# Patient Record
Sex: Male | Born: 1980
Health system: Southern US, Community
[De-identification: ages and names within clinical notes are randomized; demographics above are authoritative.]

---

## 2016-12-23 ENCOUNTER — Encounter (HOSPITAL_COMMUNITY): Payer: Self-pay | Admitting: Family Medicine

## 2016-12-23 ENCOUNTER — Ambulatory Visit (HOSPITAL_COMMUNITY)
Admission: EM | Admit: 2016-12-23 | Discharge: 2016-12-23 | Disposition: A | Discharge status: Home / Self Care | Payer: BLUE CROSS/BLUE SHIELD | Attending: Internal Medicine | Admitting: Internal Medicine

## 2016-12-23 DIAGNOSIS — N644 Mastodynia: Secondary | ICD-10-CM

## 2016-12-23 NOTE — ED Triage Notes (Signed)
Pt here for lump to left chest area. Denies drainage. Reports x 1 month and tender to touch.

## 2016-12-23 NOTE — Discharge Instructions (Signed)
Take naproxen 440mg  twice a day for 10 days for pain and inflammation. You will need further evaluation that we cannot provide here, please establish PCP care for further evaluation and treatment needed. If noticing spreading redness, increased warmth, fever, follow up for reevaluation.

## 2016-12-23 NOTE — ED Provider Notes (Signed)
[facility]    CSN: [id number] Arrival date & time: 12/23/16  1820     History   Chief Complaint Chief Complaint  Patient presents with  . Chest Pain  . Breast Mass    HPI Clayton Pacheco is a 36 y.o. male.   36 year old male comes in for 1 month history of left breast pain/mass. States that it is tender to palpation without nipple drainage. States left breast is larger than the right, breast mass has not changed in size. Denies spreading erythema, increased warmth, fever. Has not done anything for it. States used to do strenuous workout with heavy weight lifting, but has discontinued. Denies shortness of breath, diaphoresis, weakness, dizziness, numbness/tingling. Family history of cardiac disease, grandmother with bypass surgery at age 38, though without MI. Denies family history of breast cancer.       History reviewed. No pertinent past medical history.  There are no active problems to display for this patient.   History reviewed. No pertinent surgical history.     Home Medications    Prior to Admission medications   Not on File    Family History History reviewed. No pertinent family history.  Social History Social History   Tobacco Use  . Smoking status: Never Smoker  . Smokeless tobacco: Never Used  Substance Use Topics  . Alcohol use: Not on file  . Drug use: Not on file     Allergies   Patient has no known allergies.   Review of Systems Review of Systems  Reason unable to perform ROS: See HPI as above.     Physical Exam Triage Vital Signs ED Triage Vitals [12/23/16 1854]  Enc Vitals Group     BP (!) 147/98     Pulse Rate 62     Resp 18     Temp      Temp src      SpO2 100 %     Weight      Height      Head Circumference      Peak Flow      Pain Score      Pain Loc      Pain Edu?      Excl. in GC?    No data found.  Updated Vital Signs BP (!) 147/98   Pulse 62   Resp 18   SpO2 100%   Physical Exam    Constitutional: He is oriented to person, place, and time. He appears well-developed and well-nourished. No distress.  HENT:  Head: Normocephalic and atraumatic.  Eyes: Conjunctivae are normal. Pupils are equal, round, and reactive to light.  Cardiovascular: Normal rate, regular rhythm and normal heart sounds. Exam reveals no gallop and no friction rub.  No murmur heard. Pulmonary/Chest: Effort normal and breath sounds normal. No stridor. He has no wheezes. He has no rales. Tenderness: left. Right breast exhibits no inverted nipple, no mass, no nipple discharge and no skin change. Left breast exhibits tenderness. Left breast exhibits no inverted nipple, no mass, no nipple discharge and no skin change. Breasts are symmetrical.  Left breast larger than right. No erythema, increased warmth. No obvious mass palpated. Generalized tenderness to palpation of left breast.   Neurological: He is alert and oriented to person, place, and time.     UC Treatments / Results  Labs (all labs ordered are listed, but only abnormal results are displayed) Labs Reviewed - No data to display  EKG  EKG Interpretation  None       Radiology No results found.  Procedures Procedures (including critical care time)  Medications Ordered in UC Medications - No data to display   Initial Impression / Assessment and Plan / UC Course  I have reviewed the triage vital signs and the nursing notes.  Pertinent labs & imaging results that were available during my care of the patient were reviewed by me and considered in my medical decision making (see chart for details).    NSAIDs for pain and possible inflammation. Patient will need to follow up with PCP for further testing of breast asymmetry. Resources given. Return precautions given.   Final Clinical Impressions(s) / UC Diagnoses   Final diagnoses:  Breast pain, left    ED Discharge Orders    None        Lurline IdolYu, Amy V, PA-C 12/23/16 2008

## 2017-04-18 ENCOUNTER — Ambulatory Visit: Payer: BLUE CROSS/BLUE SHIELD | Admitting: Family Medicine

## 2017-04-18 VITALS — BP 125/90 | HR 62 | Temp 98.4°F | Ht 71.0 in | Wt 230.8 lb

## 2017-04-18 DIAGNOSIS — Z0000 Encounter for general adult medical examination without abnormal findings: Secondary | ICD-10-CM | POA: Diagnosis not present

## 2017-04-18 DIAGNOSIS — N6002 Solitary cyst of left breast: Secondary | ICD-10-CM | POA: Diagnosis not present

## 2017-04-18 LAB — POCT GLYCOSYLATED HEMOGLOBIN (HGB A1C): Hemoglobin A1C: 5.4

## 2017-04-18 NOTE — Patient Instructions (Addendum)
It was great meeting you today Clayton Pacheco! I am glad that you are feeling well, and that you are having no significant issues. We did some blood work today, and I will call you with those results. If you feel that the fluid in your chest is increasing, becoming more inflamed, or is hurting more please let me know and we will work this up. I am glad that you are motivated to lose weight and that you are exercising. Otherwise I will see you back in 1 year for your annual wellness visit.

## 2017-04-18 NOTE — Progress Notes (Signed)
  HPI:  Patient presents today for a new patient appointment to establish general primary care, Needs a physical for annual renewal of his health insurance offered at his job.  Only issue is that he notices asymmetry in his chest. Feels as though he has fluid under his left areola which accounts for this asymmetry. Has been improving since he saw urgent care for the same issue back in November.  ROS: See HPI  Past Medical Hx:  - none  Past Surgical Hx:  - none  Family Hx: updated in Epic  Social Hx: lives at home with his wife and step daughter. Works non physically intensive job at [employer]. Is in graduate school.  Health Maintenance:  - Unsure if has had TDAP - otherwise up to date  PHYSICAL EXAM: BP 125/90 (BP Location: Left Arm, Patient Position: Sitting, Cuff Size: Normal)   Pulse 62   Temp 98.4 F (36.9 C) (Oral)   Ht 5\' 11"  (1.803 m)   Wt 230 lb 12.8 oz (104.7 kg)   SpO2 97%   BMI 32.19 kg/m  Gen: Muscular African American male in no acute distress, resting comfortably in seat HEENT: small amount of cerumen noted in Bilateral ears R>L.  Heart: rrr, no m/r/g. Palpable peripheral pulses In BLE, BUE. Lungs: clear to auscultation bilaterally, symmetric chest rise Abdomen: soft, non-tender non-distended. Neuro: alert, oriented x3. 5/5 strength all muscle groups BUE, BLE. Cn 2-12 intact Chest: Fluid-filled area under left areola. No erythema, tenderness, or other concerning signs  ASSESSMENT/PLAN:  # Health maintenance:  - needs tdap - will order cbc, cmp, a1c, and lipid panel for screening  Breast cyst, left Likely to be a benign breast cyst. Asked patient to come back for follow up if develops erythema, increased pain, increased size. Could consider ultrasound and possible drainage if needed.     FOLLOW UP: Follow up in 1 year for annual physical   Myrene BuddyJacob Cynda Soule MD PGY-1 Family Medicine Resident

## 2017-04-19 ENCOUNTER — Encounter: Payer: Self-pay | Admitting: Family Medicine

## 2017-04-19 DIAGNOSIS — Z0000 Encounter for general adult medical examination without abnormal findings: Secondary | ICD-10-CM | POA: Insufficient documentation

## 2017-04-19 DIAGNOSIS — N6002 Solitary cyst of left breast: Secondary | ICD-10-CM | POA: Insufficient documentation

## 2017-04-19 LAB — CBC WITH DIFFERENTIAL/PLATELET
BASOS ABS: 0 10*3/uL (ref 0.0–0.2)
Basos: 1 %
EOS (ABSOLUTE): 0.3 10*3/uL (ref 0.0–0.4)
Eos: 4 %
Hematocrit: 42.5 % (ref 37.5–51.0)
Hemoglobin: 14.4 g/dL (ref 13.0–17.7)
IMMATURE GRANS (ABS): 0 10*3/uL (ref 0.0–0.1)
IMMATURE GRANULOCYTES: 0 %
LYMPHS: 37 %
Lymphocytes Absolute: 2.7 10*3/uL (ref 0.7–3.1)
MCH: 29.2 pg (ref 26.6–33.0)
MCHC: 33.9 g/dL (ref 31.5–35.7)
MCV: 86 fL (ref 79–97)
MONOS ABS: 0.6 10*3/uL (ref 0.1–0.9)
Monocytes: 8 %
NEUTROS PCT: 50 %
Neutrophils Absolute: 3.6 10*3/uL (ref 1.4–7.0)
PLATELETS: 327 10*3/uL (ref 150–379)
RBC: 4.93 x10E6/uL (ref 4.14–5.80)
RDW: 14.5 % (ref 12.3–15.4)
WBC: 7.2 10*3/uL (ref 3.4–10.8)

## 2017-04-19 LAB — COMPREHENSIVE METABOLIC PANEL
A/G RATIO: 1.3 (ref 1.2–2.2)
ALT: 19 IU/L (ref 0–44)
AST: 24 IU/L (ref 0–40)
Albumin: 4.2 g/dL (ref 3.5–5.5)
Alkaline Phosphatase: 80 IU/L (ref 39–117)
BUN/Creatinine Ratio: 9 (ref 9–20)
BUN: 12 mg/dL (ref 6–20)
Bilirubin Total: 0.3 mg/dL (ref 0.0–1.2)
CALCIUM: 9.3 mg/dL (ref 8.7–10.2)
CO2: 24 mmol/L (ref 20–29)
Chloride: 102 mmol/L (ref 96–106)
Creatinine, Ser: 1.29 mg/dL — ABNORMAL HIGH (ref 0.76–1.27)
GFR calc Af Amer: 81 mL/min/{1.73_m2} (ref >59)
GFR, EST NON AFRICAN AMERICAN: 70 mL/min/{1.73_m2} (ref >59)
GLUCOSE: 83 mg/dL (ref 65–99)
Globulin, Total: 3.2 g/dL (ref 1.5–4.5)
POTASSIUM: 4.4 mmol/L (ref 3.5–5.2)
Sodium: 141 mmol/L (ref 134–144)
Total Protein: 7.4 g/dL (ref 6.0–8.5)

## 2017-04-19 LAB — LIPID PANEL
CHOL/HDL RATIO: 5 ratio (ref 0.0–5.0)
Cholesterol, Total: 185 mg/dL (ref 100–199)
HDL: 37 mg/dL — AB (ref >39)
LDL Calculated: 117 mg/dL — ABNORMAL HIGH (ref 0–99)
TRIGLYCERIDES: 153 mg/dL — AB (ref 0–149)
VLDL CHOLESTEROL CAL: 31 mg/dL (ref 5–40)

## 2017-04-19 NOTE — Assessment & Plan Note (Signed)
Likely to be a benign breast cyst. Asked patient to come back for follow up if develops erythema, increased pain, increased size. Could consider ultrasound and possible drainage if needed.

## 2017-04-20 ENCOUNTER — Telehealth: Payer: Self-pay | Admitting: Family Medicine

## 2017-04-20 NOTE — Telephone Encounter (Signed)
Called to inform patient that A1C and cbc were both normal. Informed him that triglyceride and LDH were both slightly elevated. Suspect these were artificially elevated as he had eaten that day. Creatinine 1.27. Patient unaware of any problems with creatinine in the past. Has not been taking any supplements that could artificially elevate creatinine. Advised to continue to drink plenty of water. Titrate amount to avoid thirst and shoot for clear urine color. Also applauded for continuing to exercise well. All questions answered, patient voiced understanding.  Myrene BuddyJacob Delphin Funes MD PGY-1 Family Medicine Resident

## 2020-05-07 ENCOUNTER — Telehealth: Payer: Self-pay | Admitting: *Deleted

## 2020-05-07 NOTE — Telephone Encounter (Signed)
Patient calls because he is having a pulling sensation in chest.  He reports no injury that he is aware of.  He also reports having this sensation for about a year but since Sunday has been happening more frequently.  He denies SOB, arm pain or nausea.  No appts available today and advised if it has worried him enough to call us I think it was worth having a provider check him out. Advised urgent care.  Also pt has not been seen in 3 years.  Appt for physical made 06/09/20. Jone Baseman, CMA

## 2020-05-09 ENCOUNTER — Other Ambulatory Visit: Payer: Self-pay

## 2020-05-09 ENCOUNTER — Ambulatory Visit (HOSPITAL_COMMUNITY)
Admission: EM | Admit: 2020-05-09 | Discharge: 2020-05-09 | Disposition: A | Discharge status: Home / Self Care | Payer: BLUE CROSS/BLUE SHIELD | Attending: Internal Medicine | Admitting: Internal Medicine

## 2020-05-09 ENCOUNTER — Encounter (HOSPITAL_COMMUNITY): Payer: Self-pay

## 2020-05-09 ENCOUNTER — Emergency Department (HOSPITAL_BASED_OUTPATIENT_CLINIC_OR_DEPARTMENT_OTHER): Payer: BC Managed Care – PPO | Admitting: Radiology

## 2020-05-09 ENCOUNTER — Emergency Department (HOSPITAL_BASED_OUTPATIENT_CLINIC_OR_DEPARTMENT_OTHER)
Admission: EM | Admit: 2020-05-09 | Discharge: 2020-05-09 | Disposition: A | Discharge status: Home / Self Care | Payer: BC Managed Care – PPO | Attending: Emergency Medicine | Admitting: Emergency Medicine

## 2020-05-09 DIAGNOSIS — R030 Elevated blood-pressure reading, without diagnosis of hypertension: Secondary | ICD-10-CM

## 2020-05-09 DIAGNOSIS — R079 Chest pain, unspecified: Secondary | ICD-10-CM | POA: Diagnosis not present

## 2020-05-09 DIAGNOSIS — R7989 Other specified abnormal findings of blood chemistry: Secondary | ICD-10-CM

## 2020-05-09 LAB — BASIC METABOLIC PANEL
Anion gap: 9 (ref 5–15)
BUN: 14 mg/dL (ref 6–20)
CO2: 26 mmol/L (ref 22–32)
Calcium: 9.5 mg/dL (ref 8.9–10.3)
Chloride: 104 mmol/L (ref 98–111)
Creatinine, Ser: 1.26 mg/dL — ABNORMAL HIGH (ref 0.61–1.24)
GFR, Estimated: >60 mL/min (ref >60)
Glucose, Bld: 78 mg/dL (ref 70–99)
Potassium: 4.3 mmol/L (ref 3.5–5.1)
Sodium: 139 mmol/L (ref 135–145)

## 2020-05-09 LAB — CBC WITH DIFFERENTIAL/PLATELET
Abs Immature Granulocytes: 0.01 10*3/uL (ref 0.00–0.07)
Basophils Absolute: 0 10*3/uL (ref 0.0–0.1)
Basophils Relative: 1 %
Eosinophils Absolute: 0.3 10*3/uL (ref 0.0–0.5)
Eosinophils Relative: 4 %
HCT: 44.2 % (ref 39.0–52.0)
Hemoglobin: 14.7 g/dL (ref 13.0–17.0)
Immature Granulocytes: 0 %
Lymphocytes Relative: 33 %
Lymphs Abs: 2.4 10*3/uL (ref 0.7–4.0)
MCH: 29.1 pg (ref 26.0–34.0)
MCHC: 33.3 g/dL (ref 30.0–36.0)
MCV: 87.5 fL (ref 80.0–100.0)
Monocytes Absolute: 0.6 10*3/uL (ref 0.1–1.0)
Monocytes Relative: 8 %
Neutro Abs: 3.9 10*3/uL (ref 1.7–7.7)
Neutrophils Relative %: 54 %
Platelets: 335 10*3/uL (ref 150–400)
RBC: 5.05 MIL/uL (ref 4.22–5.81)
RDW: 13.6 % (ref 11.5–15.5)
WBC: 7.2 10*3/uL (ref 4.0–10.5)
nRBC: 0 % (ref 0.0–0.2)

## 2020-05-09 LAB — TROPONIN I (HIGH SENSITIVITY): Troponin I (High Sensitivity): 4 ng/L (ref <18)

## 2020-05-09 NOTE — ED Triage Notes (Addendum)
Pt in with c/o left CP that has been going on intermittently for the last few years but has started bothering him more this week  Denies any radiation to arm/jaw, sob, N/V, or dizziness  States that he felt the pain when he was working out and when he is resting

## 2020-05-09 NOTE — ED Provider Notes (Signed)
[facility] EMERGENCY DEPT Provider Note   CSN: [id number] Arrival date & time: 05/09/20  1252     History Chief Complaint  Patient presents with  . Chest Pain    Clayton Pacheco is a 40 y.o. male.  Patient is a 40 year old male who presents with chest pain.  He has no past medical history.  He says for the last 3 days he has had episodes of chest pain.  Describes it as an achy feeling in his left chest.  Nonradiating.  It lasted about 2 to 3 minutes.  He has had about 3 episodes once a day for the last 3 days.  He has not had any chest pain today.  He denies any exertional symptoms.  Its not related to eating.  Its not related to movement.  He does not have any associated symptoms such as shortness of breath, nausea or diaphoresis.  No history of known heart issues.  He has no history in his family of early heart disease.  He is a non-smoker.  He denies any pleuritic symptoms.  No leg pain or swelling.  No fevers, cough or cold symptoms.  He was seen in urgent care today and sent to the ED for further evaluation.        No past medical history on file.  Patient Active Problem List   Diagnosis Date Noted  . Healthcare maintenance 04/19/2017  . Breast cyst, left 04/19/2017    No past surgical history on file.     No family history on file.  Social History   Tobacco Use  . Smoking status: Never Smoker  . Smokeless tobacco: Never Used    Home Medications Prior to Admission medications   Not on File    Allergies    Patient has no known allergies.  Review of Systems   Review of Systems  Constitutional: Negative for chills, diaphoresis, fatigue and fever.  HENT: Negative for congestion, rhinorrhea and sneezing.   Eyes: Negative.   Respiratory: Negative for cough, chest tightness and shortness of breath.   Cardiovascular: Positive for chest pain. Negative for leg swelling.  Gastrointestinal: Negative for abdominal pain, blood in stool,  diarrhea, nausea and vomiting.  Genitourinary: Negative for difficulty urinating, flank pain, frequency and hematuria.  Musculoskeletal: Negative for arthralgias and back pain.  Skin: Negative for rash.  Neurological: Negative for dizziness, speech difficulty, weakness, numbness and headaches.    Physical Exam Updated Vital Signs BP (!) 132/91 (BP Location: Left Arm)   Pulse (!) 57   Temp 98 F (36.7 C)   Resp 18   Ht 5\' 11"  (1.803 m)   Wt 108.9 kg   SpO2 98%   BMI 33.47 kg/m   Physical Exam Constitutional:      Appearance: He is well-developed.  HENT:     Head: Normocephalic and atraumatic.  Eyes:     Pupils: Pupils are equal, round, and reactive to light.  Cardiovascular:     Rate and Rhythm: Normal rate and regular rhythm.     Heart sounds: Normal heart sounds.  Pulmonary:     Effort: Pulmonary effort is normal. No respiratory distress.     Breath sounds: Normal breath sounds. No wheezing or rales.  Chest:     Chest wall: No tenderness.  Abdominal:     General: Bowel sounds are normal.     Palpations: Abdomen is soft.     Tenderness: There is no abdominal tenderness. There is no guarding or rebound.  Musculoskeletal:        General: Normal range of motion.     Cervical back: Normal range of motion and neck supple.     Comments: No edema or calf tenderness  Lymphadenopathy:     Cervical: No cervical adenopathy.  Skin:    General: Skin is warm and dry.     Findings: No rash.  Neurological:     Mental Status: He is alert and oriented to person, place, and time.     ED Results / Procedures / Treatments   Labs (all labs ordered are listed, but only abnormal results are displayed) Labs Reviewed  BASIC METABOLIC PANEL - Abnormal; Notable for the following components:      Result Value   Creatinine, Ser 1.26 (*)    All other components within normal limits  CBC WITH DIFFERENTIAL/PLATELET  TROPONIN I (HIGH SENSITIVITY)  TROPONIN I (HIGH SENSITIVITY)     EKG None  Radiology DG Chest 2 View  Result Date: 05/09/2020 CLINICAL DATA:  Chest pain EXAM: CHEST - 2 VIEW COMPARISON:  None. FINDINGS: The heart size and mediastinal contours are within normal limits. Both lungs are clear. The visualized skeletal structures are unremarkable. IMPRESSION: No active cardiopulmonary disease. Electronically Signed   By: Duanne Guess D.O.   On: 05/09/2020 14:17    Procedures Procedures   Medications Ordered in ED Medications - No data to display  ED Course  I have reviewed the triage vital signs and the nursing notes.  Pertinent labs & imaging results that were available during my care of the patient were reviewed by me and considered in my medical decision making (see chart for details).    MDM Rules/Calculators/A&P                          Patient is a 40 year old male who presents with intermittent episodes of chest pain for the last 3 days.  He has not had any episodes since yesterday.  His EKG does not show any ischemic changes, other than some anterior ST elevation that is likely early repol changes.  There is no old EKG available for comparison.  His chest x-ray is clear without evidence of pneumonia or pneumothorax.  No evidence of pulmonary edema.  His CBC is nonconcerning.  His creatinine is mildly elevated but similar to prior values in 2019.  His troponin is still pending.  Dr. Renaye Rakers to take over care pending this result.  I feel the patient likely only needs 1 troponin as he has not had any pain since yesterday.  If the troponin is negative, he can likely be discharged home with outpatient follow-up.  He will need to follow-up on his elevated creatinine.  Reportedly he has a PCP appointment in April.  Final Clinical Impression(s) / ED Diagnoses Final diagnoses:  Chest pain, unspecified type  Elevated serum creatinine    Rx / DC Orders ED Discharge Orders    None       Rolan Bucco, MD 05/09/20 1511

## 2020-05-09 NOTE — Discharge Instructions (Signed)
Go to the emergency department for evaluation of your chest pain.    Your blood pressure is elevated today at 167/94.  This needs to be rechecked by your primary care provider in 2 to 4 weeks.

## 2020-05-09 NOTE — ED Triage Notes (Signed)
Pt c/o intermittent L sd chest discomfort for years. Urgent care sent to get evaluated. Denies n/v, dizziness, SOB.

## 2020-05-09 NOTE — ED Provider Notes (Signed)
[facility]    CSN: [id number] Arrival date & time: 05/09/20  1133      History   Chief Complaint Chief Complaint  Patient presents with  . chest pain    HPI Clayton Pacheco is a 40 y.o. male.   Patient presents with left sternal chest pain intermittently for the past week.  No chest pain currently.  No injury or known cause.  He describes the chest pain as dull; no aggravating or alleviating factors; no associated symptoms.  He denies personal or family cardiac history.  Non-smoker.  He does not currently have a PCP but has an appointment to establish one at the end of April.  He denies any symptoms currently, including chest pain, shortness of breath, dizziness, weakness, numbness, headache.  No pertinent medical history.  No treatments attempted at home.   The history is provided by the patient and medical records.    History reviewed. No pertinent past medical history.  Patient Active Problem List   Diagnosis Date Noted  . Healthcare maintenance 04/19/2017  . Breast cyst, left 04/19/2017    History reviewed. No pertinent surgical history.     Home Medications    Prior to Admission medications   Not on File    Family History History reviewed. No pertinent family history.  Social History Social History   Tobacco Use  . Smoking status: Never Smoker  . Smokeless tobacco: Never Used     Allergies   Patient has no known allergies.   Review of Systems Review of Systems  Constitutional: Negative for chills, diaphoresis and fever.  HENT: Negative for ear pain and sore throat.   Eyes: Negative for pain and visual disturbance.  Respiratory: Negative for cough and shortness of breath.   Cardiovascular: Positive for chest pain. Negative for palpitations.  Gastrointestinal: Negative for abdominal pain, nausea and vomiting.  Genitourinary: Negative for dysuria and hematuria.  Musculoskeletal: Negative for arthralgias and back pain.  Skin:  Negative for color change and rash.  Neurological: Negative for dizziness, syncope, weakness, numbness and headaches.  All other systems reviewed and are negative.    Physical Exam Triage Vital Signs ED Triage Vitals  Enc Vitals Group     BP 05/09/20 1158 (!) 167/94     Pulse Rate 05/09/20 1158 63     Resp 05/09/20 1158 20     Temp --      Temp src --      SpO2 05/09/20 1158 96 %     Weight --      Height --      Head Circumference --      Peak Flow --      Pain Score 05/09/20 1157 0     Pain Loc --      Pain Edu? --      Excl. in GC? --    No data found.  Updated Vital Signs BP (!) 167/94   Pulse 63   Resp 20   SpO2 96%   Visual Acuity Right Eye Distance:   Left Eye Distance:   Bilateral Distance:    Right Eye Near:   Left Eye Near:    Bilateral Near:     Physical Exam Vitals and nursing note reviewed.  Constitutional:      General: He is not in acute distress.    Appearance: He is well-developed. He is not ill-appearing.  HENT:     Head: Normocephalic and atraumatic.     Mouth/Throat:  Mouth: Mucous membranes are moist.  Eyes:     Conjunctiva/sclera: Conjunctivae normal.  Cardiovascular:     Rate and Rhythm: Normal rate and regular rhythm.     Heart sounds: Normal heart sounds.  Pulmonary:     Effort: Pulmonary effort is normal. No respiratory distress.     Breath sounds: Normal breath sounds.  Abdominal:     Palpations: Abdomen is soft.     Tenderness: There is no abdominal tenderness.  Musculoskeletal:     Cervical back: Neck supple.     Right lower leg: No edema.     Left lower leg: No edema.  Skin:    General: Skin is warm and dry.  Neurological:     General: No focal deficit present.     Mental Status: He is alert and oriented to person, place, and time.     Sensory: No sensory deficit.     Motor: No weakness.     Gait: Gait normal.  Psychiatric:        Mood and Affect: Mood normal.        Behavior: Behavior normal.      UC  Treatments / Results  Labs (all labs ordered are listed, but only abnormal results are displayed) Labs Reviewed - No data to display  EKG   Radiology No results found.  Procedures Procedures (including critical care time)  Medications Ordered in UC Medications - No data to display  Initial Impression / Assessment and Plan / UC Course  I have reviewed the triage vital signs and the nursing notes.  Pertinent labs & imaging results that were available during my care of the patient were reviewed by me and considered in my medical decision making (see chart for details).   Chest pain, elevated blood pressure reading.  EKG shows sinus rhythm, rate 61; 1 mm ST elevation in leads V2, V3, V4; no previous to compare.  Discussed limitations of evaluation of chest pain in an urgent care setting.  Instructed patient to be evaluated in the ED.  Also discussed that his blood pressure is elevated today and needs to be rechecked in 2 to 4 weeks.  He has an appointment to establish a PCP in 4 weeks.  He agrees to plan of care.   Final Clinical Impressions(s) / UC Diagnoses   Final diagnoses:  Chest pain, unspecified type  Elevated blood pressure reading     Discharge Instructions     Go to the emergency department for evaluation of your chest pain.    Your blood pressure is elevated today at 167/94.  This needs to be rechecked by your primary care provider in 2 to 4 weeks.        ED Prescriptions    None     PDMP not reviewed this encounter.   Mickie Bail, NP 05/09/20 1236

## 2020-06-04 NOTE — Progress Notes (Deleted)
    SUBJECTIVE:   CHIEF COMPLAINT / HPI: physical   Physical   ***  PERTINENT  PMH / PSH: ***  OBJECTIVE:   There were no vitals taken for this visit.  ***  ASSESSMENT/PLAN:   No problem-specific Assessment & Plan notes found for this encounter.     Ronnald Ramp, MD [facility]   {    This will disappear when note is signed, click to select method of visit    :1}

## 2020-06-09 ENCOUNTER — Encounter: Payer: BLUE CROSS/BLUE SHIELD | Admitting: Family Medicine

## 2020-06-15 NOTE — Progress Notes (Signed)
Cardiology Office Note:    Date:  06/16/2020   ID:  Clayton Pacheco, DOB August 19, 1980, MRN [id number]  PCP:  Patient, No Pcp Per (Inactive)  Cardiologist:  None  Electrophysiologist:  None   Referring MD: No ref. provider found   Chief Complaint  Patient presents with  . Chest Pain    History of Present Illness:    Clayton Pacheco is a 40 y.o. male with ano PMHx who presents for evaluation of chest pain.  He was seen in the ED on 05/09/2020 with chest pain.  EKG showed inferior ST elevations likely representing early repolarization changes.  Troponin was negative.  He reports that he has been having chest pain about once per day.  Describes as left-sided pressure, lasts up to 1 minute.  Has not noted relationship with exertion.  He exercises by going for walks/jogging.  No smoking history.  Family history includes paternal grandmother had PCI in 73s.    BP Readings from Last 3 Encounters:  06/16/20 (!) 142/110  05/09/20 129/80  05/09/20 (!) 167/94     No past medical history on file.  No past surgical history on file.  Current Medications: Current Meds  Medication Sig  . metoprolol tartrate (LOPRESSOR) 100 MG tablet Take 1 tablet (100 mg total) by mouth once for 1 dose. 2 hours before CT     Allergies:   Patient has no known allergies.   Social History   Socioeconomic History  . Marital status: Married    Spouse name: Not on file  . Number of children: Not on file  . Years of education: Not on file  . Highest education level: Not on file  Occupational History  . Not on file  Tobacco Use  . Smoking status: Never Smoker  . Smokeless tobacco: Never Used  Substance and Sexual Activity  . Alcohol use: Not on file  . Drug use: Not on file  . Sexual activity: Not on file  Other Topics Concern  . Not on file  Social History Narrative  . Not on file   Social Determinants of Health   Financial Resource Strain: Not on file  Food Insecurity: Not on file   Transportation Needs: Not on file  Physical Activity: Not on file  Stress: Not on file  Social Connections: Not on file     Family History: Family history includes paternal grandmother had PCI in 45s.  ROS:   Please see the history of present illness.     All other systems reviewed and are negative.  EKGs/Labs/Other Studies Reviewed:    The following studies were reviewed today:   EKG:  EKG is  ordered today.  The ekg ordered today demonstrates normal sinus rhythm, rate 70, concave ST elevation in V1/2, nonspecific T wave flattening  Recent Labs: 05/09/2020: BUN 14; Creatinine, Ser 1.26; Hemoglobin 14.7; Platelets 335; Potassium 4.3; Sodium 139  Recent Lipid Panel    Component Value Date/Time   CHOL 185 04/18/2017 1712   TRIG 153 (H) 04/18/2017 1712   HDL 37 (L) 04/18/2017 1712   CHOLHDL 5.0 04/18/2017 1712   LDLCALC 117 (H) 04/18/2017 1712    Physical Exam:    VS:  BP (!) 142/110   Pulse 70   Ht 5\' 11"  (1.803 m)   Wt 246 lb 6.4 oz (111.8 kg)   SpO2 97%   BMI 34.37 kg/m     Wt Readings from Last 3 Encounters:  06/16/20 246 lb 6.4 oz (111.8 kg)  05/09/20 240 lb (108.9 kg)  04/18/17 230 lb 12.8 oz (104.7 kg)     GEN: Well nourished, well developed in no acute distress HEENT: Normal NECK: No JVD; No carotid bruits LYMPHATICS: No lymphadenopathy CARDIAC: RRR, no murmurs, rubs, gallops RESPIRATORY:  Clear to auscultation without rales, wheezing or rhonchi  ABDOMEN: Soft, non-tender, non-distended MUSCULOSKELETAL:  No edema; No deformity  SKIN: Warm and dry NEUROLOGIC:  Alert and oriented x 3 PSYCHIATRIC:  Normal affect   ASSESSMENT:    1. Chest pain of uncertain etiology   2. Chest pain, unspecified type   3. Pre-procedure lab exam   4. Lipid screening    PLAN:    Chest pain: Atypical in description but does have CAD risk factors with likely undiagnosed hypertension.  EKG with baseline ST abnormalities, not a good stress EKG candidate.  Recommend  coronary CTA to rule out obstructive CAD.  Will check echocardiogram to rule out structural heart disease.  Hypertension: BP one 142/110 in clinic today.  Recommended starting antihypertensive, but patient declined.  Advised to start checking BP twice daily for next week, and has follow-up at the end of week with PCP.  If BP remains elevated, would start antihypertensive.  Lipid screening: Will check lipid panel  RTC in 3 months   Medication Adjustments/Labs and Tests Ordered: Current medicines are reviewed at length with the patient today.  Concerns regarding medicines are outlined above.  Orders Placed This Encounter  Procedures  . CT CORONARY MORPH W/CTA COR W/SCORE W/CA W/CM &/OR WO/CM  . Lipid panel  . Basic metabolic panel  . EKG 12-Lead  . ECHOCARDIOGRAM COMPLETE   Meds ordered this encounter  Medications  . metoprolol tartrate (LOPRESSOR) 100 MG tablet    Sig: Take 1 tablet (100 mg total) by mouth once for 1 dose. 2 hours before CT    Dispense:  1 tablet    Refill:  0    Patient Instructions  Medication Instructions:  Take metoprolol tartrate 100mg  tablet once two hours prior to CT scan.  *If you need a refill on your cardiac medications before your next appointment, please call your pharmacy*   Lab Work: BMET, Lipid, to be drawn today  If you have labs (blood work) drawn today and your tests are completely normal, you will receive your results only by: MyChart Message (if you have MyChart) OR . A paper copy in the mail If you have any lab test that is abnormal or we need to change your treatment, we will call you to review the results.   Testing/Procedures: Your physician has requested that you have an echocardiogram. Echocardiography is a painless test that uses sound waves to create images of your heart. It provides your doctor with information about the size and shape of your heart and how well your heart's chambers and valves are working. This procedure  takes approximately one hour. There are no restrictions for this procedure.  Coronary CTA - see instructions below Your cardiac CT will be scheduled at one of the below locations:   [facility] [address] [place], [place] [phone]  If scheduled at [facility], please arrive at the [facility] main entrance (entrance A) of [facility] 30 minutes prior to test start time. Proceed to the [facility] Radiology Department (first floor) to check-in and test prep.  If scheduled at [facility], please arrive 15 mins early for check-in and test prep.  Please follow  these instructions carefully (unless otherwise directed):  Hold all erectile dysfunction medications at least 3 days (72 hrs) prior to test.  On the Night Before the Test: . Be sure to Drink plenty of water. . Do not consume any caffeinated/decaffeinated beverages or chocolate 12 hours prior to your test. . Do not take any antihistamines 12 hours prior to your test.  On the Day of the Test: . Drink plenty of water until 1 hour prior to the test. . Do not eat any food 4 hours prior to the test. . You may take your regular medications prior to the test.  . Take metoprolol (Lopressor) two hours prior to test.         After the Test: . Drink plenty of water. . After receiving IV contrast, you may experience a mild flushed feeling. This is normal. . On occasion, you may experience a mild rash up to 24 hours after the test. This is not dangerous. If this occurs, you can take Benadryl 25 mg and increase your fluid intake. . If you experience trouble breathing, this can be serious. If it is severe call 911 IMMEDIATELY. If it is mild, please call our office. . If you take any of these medications: Glipizide/Metformin, Avandament, Glucavance, please do not take 48 hours after completing test unless otherwise instructed.   Once we have confirmed authorization  from your insurance company, we will call you to set up a date and time for your test. Based on how quickly your insurance processes prior authorizations requests, please allow up to 4 weeks to be contacted for scheduling your Cardiac CT appointment. Be advised that routine Cardiac CT appointments could be scheduled as many as 8 weeks after your provider has ordered it.  For non-scheduling related questions, please contact the cardiac imaging nurse navigator should you have any questions/concerns: [facility] Alexandria, Cardiac Imaging Nurse Navigator Larey Brick, Cardiac Imaging Nurse Navigator [facility] Direct Office Dial: [phone]   For scheduling needs, including cancellations and rescheduling, please call [place], [phone].    Follow-Up: At [facility], you and your health needs are our priority.  As part of our continuing mission to provide you with exceptional heart care, we have created designated Provider Care Teams.  These Care Teams include your primary Cardiologist (physician) and Advanced Practice Providers (APPs -  Physician Assistants and Nurse Practitioners) who all work together to provide you with the care you need, when you need it.  We recommend signing up for the patient portal called "MyChart".  Sign up information is provided on this After Visit Summary.  MyChart is used to connect with patients for Virtual Visits (Telemedicine).  Patients are able to view lab/test results, encounter notes, upcoming appointments, etc.  Non-urgent messages can be sent to your provider as well.   To learn more about what you can do with MyChart, go to [email or web address].    Your next appointment:   3 month(s)  The format for your next appointment:   In Person  Provider:   Epifanio Lesches, MD   Other Instructions Take blood pressures 2 times a day. Record your blood pressures and take them to your primary care visit, or call office/send  through MyChart.      Signed, Little Ishikawa, MD  06/16/2020 11:41 AM    [facility]

## 2020-06-16 ENCOUNTER — Ambulatory Visit: Payer: BC Managed Care – PPO | Admitting: Cardiology

## 2020-06-16 ENCOUNTER — Other Ambulatory Visit: Payer: Self-pay

## 2020-06-16 ENCOUNTER — Encounter: Payer: Self-pay | Admitting: Cardiology

## 2020-06-16 VITALS — BP 142/110 | HR 70 | Ht 71.0 in | Wt 246.4 lb

## 2020-06-16 DIAGNOSIS — Z01812 Encounter for preprocedural laboratory examination: Secondary | ICD-10-CM

## 2020-06-16 DIAGNOSIS — I10 Essential (primary) hypertension: Secondary | ICD-10-CM

## 2020-06-16 DIAGNOSIS — Z13220 Encounter for screening for lipoid disorders: Secondary | ICD-10-CM | POA: Diagnosis not present

## 2020-06-16 DIAGNOSIS — R079 Chest pain, unspecified: Secondary | ICD-10-CM

## 2020-06-16 LAB — BASIC METABOLIC PANEL
BUN/Creatinine Ratio: 7 — ABNORMAL LOW (ref 9–20)
BUN: 10 mg/dL (ref 6–24)
CO2: 23 mmol/L (ref 20–29)
Calcium: 10 mg/dL (ref 8.7–10.2)
Chloride: 102 mmol/L (ref 96–106)
Creatinine, Ser: 1.36 mg/dL — ABNORMAL HIGH (ref 0.76–1.27)
Glucose: 87 mg/dL (ref 65–99)
Potassium: 4.6 mmol/L (ref 3.5–5.2)
Sodium: 140 mmol/L (ref 134–144)
eGFR: 67 mL/min/{1.73_m2} (ref >59)

## 2020-06-16 LAB — LIPID PANEL
Chol/HDL Ratio: 5.2 ratio — ABNORMAL HIGH (ref 0.0–5.0)
Cholesterol, Total: 208 mg/dL — ABNORMAL HIGH (ref 100–199)
HDL: 40 mg/dL (ref >39)
LDL Chol Calc (NIH): 141 mg/dL — ABNORMAL HIGH (ref 0–99)
Triglycerides: 152 mg/dL — ABNORMAL HIGH (ref 0–149)
VLDL Cholesterol Cal: 27 mg/dL (ref 5–40)

## 2020-06-16 MED ORDER — METOPROLOL TARTRATE 100 MG PO TABS: 100.0000 mg | ORAL_TABLET | Freq: Once | ORAL | 0 refills | Status: AC

## 2020-06-16 NOTE — Patient Instructions (Signed)
Medication Instructions:  Take metoprolol tartrate 100mg  tablet once two hours prior to CT scan.  *If you need a refill on your cardiac medications before your next appointment, please call your pharmacy*   Lab Work: BMET, Lipid, to be drawn today  If you have labs (blood work) drawn today and your tests are completely normal, you will receive your results only by: MyChart Message (if you have MyChart) OR . A paper copy in the mail If you have any lab test that is abnormal or we need to change your treatment, we will call you to review the results.   Testing/Procedures: Your physician has requested that you have an echocardiogram. Echocardiography is a painless test that uses sound waves to create images of your heart. It provides your doctor with information about the size and shape of your heart and how well your heart's chambers and valves are working. This procedure takes approximately one hour. There are no restrictions for this procedure.  Coronary CTA - see instructions below Your cardiac CT will be scheduled at one of the below locations:   [facility] [address] [place], [place] [phone]  If scheduled at [facility], please arrive at the [facility] main entrance (entrance A) of [facility] 30 minutes prior to test start time. Proceed to the [facility] Radiology Department (first floor) to check-in and test prep.  If scheduled at [facility], please arrive 15 mins early for check-in and test prep.  Please follow these instructions carefully (unless otherwise directed):  Hold all erectile dysfunction medications at least 3 days (72 hrs) prior to test.  On the Night Before the Test: . Be sure to Drink plenty of water. . Do not consume any caffeinated/decaffeinated beverages or chocolate 12 hours prior to your test. . Do not take any antihistamines 12 hours prior to your test.  On the Day of the  Test: . Drink plenty of water until 1 hour prior to the test. . Do not eat any food 4 hours prior to the test. . You may take your regular medications prior to the test.  . Take metoprolol (Lopressor) two hours prior to test.         After the Test: . Drink plenty of water. . After receiving IV contrast, you may experience a mild flushed feeling. This is normal. . On occasion, you may experience a mild rash up to 24 hours after the test. This is not dangerous. If this occurs, you can take Benadryl 25 mg and increase your fluid intake. . If you experience trouble breathing, this can be serious. If it is severe call 911 IMMEDIATELY. If it is mild, please call our office. . If you take any of these medications: Glipizide/Metformin, Avandament, Glucavance, please do not take 48 hours after completing test unless otherwise instructed.   Once we have confirmed authorization from your insurance company, we will call you to set up a date and time for your test. Based on how quickly your insurance processes prior authorizations requests, please allow up to 4 weeks to be contacted for scheduling your Cardiac CT appointment. Be advised that routine Cardiac CT appointments could be scheduled as many as 8 weeks after your provider has ordered it.  For non-scheduling related questions, please contact the cardiac imaging nurse navigator should you have any questions/concerns: [facility], Cardiac Imaging Nurse Navigator Rockwell Alexandria, Cardiac Imaging Nurse Navigator [facility] Direct  Office Dial: [phone]   For scheduling needs, including cancellations and rescheduling, please call [place], [phone].    Follow-Up: At [facility], you and your health needs are our priority.  As part of our continuing mission to provide you with exceptional heart care, we have created designated Provider Care Teams.  These Care Teams include your primary Cardiologist (physician)  and Advanced Practice Providers (APPs -  Physician Assistants and Nurse Practitioners) who all work together to provide you with the care you need, when you need it.  We recommend signing up for the patient portal called "MyChart".  Sign up information is provided on this After Visit Summary.  MyChart is used to connect with patients for Virtual Visits (Telemedicine).  Patients are able to view lab/test results, encounter notes, upcoming appointments, etc.  Non-urgent messages can be sent to your provider as well.   To learn more about what you can do with MyChart, go to [email or web address].    Your next appointment:   3 month(s)  The format for your next appointment:   In Person  Provider:   Epifanio Lesches, MD   Other Instructions Take blood pressures 2 times a day. Record your blood pressures and take them to your primary care visit, or call office/send through MyChart.

## 2020-06-19 ENCOUNTER — Other Ambulatory Visit: Payer: Self-pay

## 2020-06-19 ENCOUNTER — Encounter: Payer: Self-pay | Admitting: Family Medicine

## 2020-06-19 ENCOUNTER — Ambulatory Visit (INDEPENDENT_AMBULATORY_CARE_PROVIDER_SITE_OTHER): Payer: BC Managed Care – PPO | Admitting: Family Medicine

## 2020-06-19 VITALS — BP 142/92 | HR 72 | Wt 247.6 lb

## 2020-06-19 DIAGNOSIS — Z125 Encounter for screening for malignant neoplasm of prostate: Secondary | ICD-10-CM

## 2020-06-19 DIAGNOSIS — Z0000 Encounter for general adult medical examination without abnormal findings: Secondary | ICD-10-CM | POA: Diagnosis not present

## 2020-06-19 DIAGNOSIS — Z114 Encounter for screening for human immunodeficiency virus [HIV]: Secondary | ICD-10-CM

## 2020-06-19 DIAGNOSIS — I10 Essential (primary) hypertension: Secondary | ICD-10-CM

## 2020-06-19 DIAGNOSIS — Z1159 Encounter for screening for other viral diseases: Secondary | ICD-10-CM

## 2020-06-19 MED ORDER — AMLODIPINE BESYLATE 5 MG PO TABS: 5.0000 mg | ORAL_TABLET | Freq: Every day | ORAL | 1 refills | Status: DC

## 2020-06-19 NOTE — Patient Instructions (Addendum)
It was a pleasure to see you today!  Thank you for choosing [facility] for your primary care.   Clayton Pacheco was seen for hypertension follow up.   Our plans for today were:  Hypertension: I have started a medication to help control your blood pressure. Please take 5mg  daily. Please plan to follow up with me in 2 weeks.   I will follow up with you once you have seen cardiology regarding your cholesterol.   I will follow up with your regarding today's blood work once it is available.   To keep you healthy, please keep in mind the following health maintenance items that you are due for:   1. Tetanus Vaccine  2.  COVID vaccine    You should return to our clinic in 2 weeks for blood pressure follow up.   Best Wishes,   Dr. Neita GarnetSimmons-Robinson    Managing Your Hypertension Hypertension, also called high blood pressure, is when the force of the blood pressing against the walls of the arteries is too strong. Arteries are blood vessels that carry blood from your heart throughout your body. Hypertension forces the heart to work harder to pump blood and may cause the arteries to become narrow or stiff. Understanding blood pressure readings Your personal target blood pressure may vary depending on your medical conditions, your age, and other factors. A blood pressure reading includes a higher number over a lower number. Ideally, your blood pressure should be below 120/80. You should know that:  The first, or top, number is called the systolic pressure. It is a measure of the pressure in your arteries as your heart beats.  The second, or bottom number, is called the diastolic pressure. It is a measure of the pressure in your arteries as the heart relaxes. Blood pressure is classified into four stages. Based on your blood pressure reading, your health care provider may use the following stages to determine what type of treatment you need, if any. Systolic pressure and diastolic  pressure are measured in a unit called mmHg. Normal  Systolic pressure: below 120.  Diastolic pressure: below 80. Elevated  Systolic pressure: 120-129.  Diastolic pressure: below 80. Hypertension stage 1  Systolic pressure: 130-139.  Diastolic pressure: 80-89. Hypertension stage 2  Systolic pressure: 140 or above.  Diastolic pressure: 90 or above. How can this condition affect me? Managing your hypertension is an important responsibility. Over time, hypertension can damage the arteries and decrease blood flow to important parts of the body, including the brain, heart, and kidneys. Having untreated or uncontrolled hypertension can lead to:  A heart attack.  A stroke.  A weakened blood vessel (aneurysm).  Heart failure.  Kidney damage.  Eye damage.  Metabolic syndrome.  Memory and concentration problems.  Vascular dementia. What actions can I take to manage this condition? Hypertension can be managed by making lifestyle changes and possibly by taking medicines. Your health care provider will help you make a plan to bring your blood pressure within a normal range. Nutrition  Eat a diet that is high in fiber and potassium, and low in salt (sodium), added sugar, and fat. An example eating plan is called the Dietary Approaches to Stop Hypertension (DASH) diet. To eat this way: ? Eat plenty of fresh fruits and vegetables. Try to fill one-half of your plate at each meal with fruits and vegetables. ? Eat whole grains, such as whole-wheat pasta, brown rice, or whole-grain bread. Fill about one-fourth of your plate with  whole grains. ? Eat low-fat dairy products. ? Avoid fatty cuts of meat, processed or cured meats, and poultry with skin. Fill about one-fourth of your plate with lean proteins such as fish, chicken without skin, beans, eggs, and tofu. ? Avoid pre-made and processed foods. These tend to be higher in sodium, added sugar, and fat.  Reduce your daily sodium  intake. Most people with hypertension should eat less than 1,500 mg of sodium a day.   Lifestyle  Work with your health care provider to maintain a healthy body weight or to lose weight. Ask what an ideal weight is for you.  Get at least 30 minutes of exercise that causes your heart to beat faster (aerobic exercise) most days of the week. Activities may include walking, swimming, or biking.  Include exercise to strengthen your muscles (resistance exercise), such as weight lifting, as part of your weekly exercise routine. Try to do these types of exercises for 30 minutes at least 3 days a week.  Do not use any products that contain nicotine or tobacco, such as cigarettes, e-cigarettes, and chewing tobacco. If you need help quitting, ask your health care provider.  Control any long-term (chronic) conditions you have, such as high cholesterol or diabetes.  Identify your sources of stress and find ways to manage stress. This may include meditation, deep breathing, or making time for fun activities.   Alcohol use  Do not drink alcohol if: ? Your health care provider tells you not to drink. ? You are pregnant, may be pregnant, or are planning to become pregnant.  If you drink alcohol: ? Limit how much you use to:  0-1 drink a day for women.  0-2 drinks a day for men. ? Be aware of how much alcohol is in your drink. In the [place] one drink equals one 12 oz bottle of beer (355 mL), one 5 oz glass of wine (148 mL), or one 1 oz glass of hard liquor (44 mL). Medicines Your health care provider may prescribe medicine if lifestyle changes are not enough to get your blood pressure under control and if:  Your systolic blood pressure is 130 or higher.  Your diastolic blood pressure is 80 or higher. Take medicines only as told by your health care provider. Follow the directions carefully. Blood pressure medicines must be taken as told by your health care provider. The medicine does not work as well  when you skip doses. Skipping doses also puts you at risk for problems. Monitoring Before you monitor your blood pressure:  Do not smoke, drink caffeinated beverages, or exercise within 30 minutes before taking a measurement.  Use the bathroom and empty your bladder (urinate).  Sit quietly for at least 5 minutes before taking measurements. Monitor your blood pressure at home as told by your health care provider. To do this:  Sit with your back straight and supported.  Place your feet flat on the floor. Do not cross your legs.  Support your arm on a flat surface, such as a table. Make sure your upper arm is at heart level.  Each time you measure, take two or three readings one minute apart and record the results. You may also need to have your blood pressure checked regularly by your health care provider.   General information  Talk with your health care provider about your diet, exercise habits, and other lifestyle factors that may be contributing to hypertension.  Review all the medicines you take with your health care provider  because there may be side effects or interactions.  Keep all visits as told by your health care provider. Your health care provider can help you create and adjust your plan for managing your high blood pressure. Where to find more information  National Heart, Lung, and Blood Institute: PopSteam.is  American Heart Association: www.heart.org Contact a health care provider if:  You think you are having a reaction to medicines you have taken.  You have repeated (recurrent) headaches.  You feel dizzy.  You have swelling in your ankles.  You have trouble with your vision. Get help right away if:  You develop a severe headache or confusion.  You have unusual weakness or numbness, or you feel faint.  You have severe pain in your chest or abdomen.  You vomit repeatedly.  You have trouble breathing. These symptoms may represent a serious  problem that is an emergency. Do not wait to see if the symptoms will go away. Get medical help right away. Call your local emergency services (911 in the [place] Do not drive yourself to the hospital. Summary  Hypertension is when the force of blood pumping through your arteries is too strong. If this condition is not controlled, it may put you at risk for serious complications.  Your personal target blood pressure may vary depending on your medical conditions, your age, and other factors. For most people, a normal blood pressure is less than 120/80.  Hypertension is managed by lifestyle changes, medicines, or both.  Lifestyle changes to help manage hypertension include losing weight, eating a healthy, low-sodium diet, exercising more, stopping smoking, and limiting alcohol. This information is not intended to replace advice given to you by your health care provider. Make sure you discuss any questions you have with your health care provider. Document Revised: 03/08/2019 Document Reviewed: 01/01/2019 Elsevier Patient Education  2021 Elsevier Inc.    Fat and Cholesterol Restricted Eating Plan Getting too much fat and cholesterol in your diet may cause health problems. Choosing the right foods helps keep your fat and cholesterol at normal levels. This can keep you from getting certain diseases. Your doctor may recommend an eating plan that includes:  Total fat: ______% or less of total calories a day.  Saturated fat: ______% or less of total calories a day.  Cholesterol: less than _________mg a day.  Fiber: ______g a day. What are tips for following this plan? Meal planning  At meals, divide your plate into four equal parts: ? Fill one-half of your plate with vegetables and green salads. ? Fill one-fourth of your plate with whole grains. ? Fill one-fourth of your plate with low-fat (lean) protein foods.  Eat fish that is high in omega-3 fats at least two times a week. This includes  mackerel, tuna, sardines, and salmon.  Eat foods that are high in fiber, such as whole grains, beans, apples, broccoli, carrots, peas, and barley. General tips  Work with your doctor to lose weight if you need to.  Avoid: ? Foods with added sugar. ? Fried foods. ? Foods with partially hydrogenated oils.  Limit alcohol intake to no more than 1 drink a day for nonpregnant women and 2 drinks a day for men. One drink equals 12 oz of beer, 5 oz of wine, or 1 oz of hard liquor.   Reading food labels  Check food labels for: ? Trans fats. ? Partially hydrogenated oils. ? Saturated fat (g) in each serving. ? Cholesterol (mg) in each serving. ? Fiber (g) in  each serving.  Choose foods with healthy fats, such as: ? Monounsaturated fats. ? Polyunsaturated fats. ? Omega-3 fats.  Choose grain products that have whole grains. Look for the word "whole" as the first word in the ingredient list. Cooking  Cook foods using low-fat methods. These include baking, boiling, grilling, and broiling.  Eat more home-cooked foods. Eat at restaurants and buffets less often.  Avoid cooking using saturated fats, such as butter, cream, palm oil, palm kernel oil, and coconut oil. Recommended foods Fruits  All fresh, canned (in natural juice), or frozen fruits. Vegetables  Fresh or frozen vegetables (raw, steamed, roasted, or grilled). Green salads. Grains  Whole grains, such as whole wheat or whole grain breads, crackers, cereals, and pasta. Unsweetened oatmeal, bulgur, barley, quinoa, or brown rice. Corn or whole wheat flour tortillas. Meats and other protein foods  Ground beef (85% or leaner), grass-fed beef, or beef trimmed of fat. Skinless chicken or [place]. Ground chicken or [place]. Pork trimmed of fat. All fish and seafood. Egg whites. Dried beans, peas, or lentils. Unsalted nuts or seeds. Unsalted canned beans. Nut butters without added sugar or oil. Dairy  Low-fat or nonfat dairy products,  such as skim or 1% milk, 2% or reduced-fat cheeses, low-fat and fat-free ricotta or cottage cheese, or plain low-fat and nonfat yogurt. Fats and oils  Tub margarine without trans fats. Light or reduced-fat mayonnaise and salad dressings. Avocado. Olive, canola, sesame, or safflower oils. The items listed above may not be a complete list of foods and beverages you can eat. Contact a dietitian for more information.   Foods to avoid Fruits  Canned fruit in heavy syrup. Fruit in cream or butter sauce. Fried fruit. Vegetables  Vegetables cooked in cheese, cream, or butter sauce. Fried vegetables. Grains  White bread. White pasta. White rice. Cornbread. Bagels, pastries, and croissants. Crackers and snack foods that contain trans fat and hydrogenated oils. Meats and other protein foods  Fatty cuts of meat. Ribs, chicken wings, bacon, sausage, bologna, salami, chitterlings, fatback, hot dogs, bratwurst, and packaged lunch meats. Liver and organ meats. Whole eggs and egg yolks. Chicken and [place] with skin. Fried meat. Dairy  Whole or 2% milk, cream, half-and-half, and cream cheese. Whole milk cheeses. Whole-fat or sweetened yogurt. Full-fat cheeses. Nondairy creamers and whipped toppings. Processed cheese, cheese spreads, and cheese curds. Beverages  Alcohol. Sugar-sweetened drinks such as sodas, lemonade, and fruit drinks. Fats and oils  Butter, stick margarine, lard, shortening, ghee, or bacon fat. Coconut, palm kernel, and palm oils. Sweets and desserts  Corn syrup, sugars, honey, and molasses. Candy. Jam and jelly. Syrup. Sweetened cereals. Cookies, pies, cakes, donuts, muffins, and ice cream. The items listed above may not be a complete list of foods and beverages you should avoid. Contact a dietitian for more information. Summary  Choosing the right foods helps keep your fat and cholesterol at normal levels. This can keep you from getting certain diseases.  At meals, fill one-half  of your plate with vegetables and green salads.  Eat high-fiber foods, like whole grains, beans, apples, carrots, peas, and barley.  Limit added sugar, saturated fats, alcohol, and fried foods. This information is not intended to replace advice given to you by your health care provider. Make sure you discuss any questions you have with your health care provider. Document Revised: 06/05/2019 Document Reviewed: 06/05/2019 Elsevier Patient Education  2021 [employer].

## 2020-06-19 NOTE — Progress Notes (Signed)
    SUBJECTIVE:   CHIEF COMPLAINT / HPI: HTN f/u   HTN  Patient reports that he has had some elevated BP measurements in the past.  He was recently evaluated by cardiology and reports normal results thus far in his evaluation for left sided chest discomfort. Patient reports that he currently does not take any medication for blood pressure. He denies any current chest discomfort and denies frequent HA. He reports heart disease in his 42 year old grandmother. He does not measure his blood pressure at home.   HM  COVID, Hep C recommended. Patient is agreeable to testing. He is also requesting to have his prostate levels checked. Patient denies dysuria or difficulty initiating urine stream or polyuria/nocturia. He denies family hx of prostate cancer.   PERTINENT  PMH / PSH:  Elevated BP readings   OBJECTIVE:   BP (!) 142/92   Pulse 72   Wt 247 lb 9.6 oz (112.3 kg)   SpO2 96%   BMI 34.53 kg/m   General: male appearing stated age in no acute distress HEENT: MMM, no oral lesions noted,Neck non-tender without lymphadenopathy, masses or thyromegaly Cardio: Normal S1 and S2, no S3 or S4. Rhythm is regular. No murmurs or rubs.  Bilateral radial pulses palpable Pulm: Clear to auscultation bilaterally, no crackles, wheezing, or diminished breath sounds. Normal respiratory effort, stable on RA Abdomen: Bowel sounds normal. Abdomen soft and non-tender.  Extremities: No peripheral edema. Warm/ well perfused.  Neuro: pt alert and oriented x4, PERRLA, EOMI   ASSESSMENT/PLAN:   Healthcare maintenance Hep C screening  HIV screening  PSA screening per patient's request   HTN (hypertension) Elevated BP today consistent with prior measurements.  Patient agreeable to starting amlodipine 5mg  daily  Will follow up in 1-2 weeks for [facility]  Patient following with cardiology currently and planning for echo 5/25.  Will hold off on lipid measurements until cardiology evaluation completed as patient  reports cardiology wanted to complete testing first      6/25, MD [facility]

## 2020-06-20 LAB — HEPATITIS C ANTIBODY: Hep C Virus Ab: <0.1 s/co ratio (ref 0.0–0.9)

## 2020-06-20 LAB — PSA: Prostate Specific Ag, Serum: 1.2 ng/mL (ref 0.0–4.0)

## 2020-06-20 LAB — HIV ANTIBODY (ROUTINE TESTING W REFLEX): HIV Screen 4th Generation wRfx: NONREACTIVE

## 2020-06-21 DIAGNOSIS — I10 Essential (primary) hypertension: Secondary | ICD-10-CM | POA: Insufficient documentation

## 2020-06-21 NOTE — Assessment & Plan Note (Signed)
Elevated BP today consistent with prior measurements.  Patient agreeable to starting amlodipine 5mg  daily  Will follow up in 1-2 weeks for [facility]  Patient following with cardiology currently and planning for echo 5/25.  Will hold off on lipid measurements until cardiology evaluation completed as patient reports cardiology wanted to complete testing first

## 2020-06-21 NOTE — Assessment & Plan Note (Signed)
Hep C screening  HIV screening  PSA screening per patient's request

## 2020-06-24 ENCOUNTER — Encounter: Payer: Self-pay | Admitting: Family Medicine

## 2020-07-06 ENCOUNTER — Telehealth (HOSPITAL_COMMUNITY): Payer: Self-pay | Admitting: Emergency Medicine

## 2020-07-06 NOTE — Telephone Encounter (Signed)
Reaching out to patient to offer assistance regarding upcoming cardiac imaging study; pt verbalizes understanding of appt date/time, parking situation and where to check in, pre-test NPO status and medications ordered, and verified current allergies; name and call back number provided for further questions should they arise Rockwell Alexandria RN Navigator Cardiac Imaging [facility] [phone] office [phone] cell   100mg  metoprolol tartrate 2 hr prior (pt reminded to pick this up from the pharm)  

## 2020-07-08 ENCOUNTER — Ambulatory Visit (HOSPITAL_COMMUNITY)
Admission: RE | Admit: 2020-07-08 | Discharge: 2020-07-08 | Disposition: A | Discharge status: Home / Self Care | Payer: BC Managed Care – PPO | Source: Ambulatory Visit | Attending: Cardiology | Admitting: Cardiology

## 2020-07-08 ENCOUNTER — Other Ambulatory Visit: Payer: Self-pay

## 2020-07-08 DIAGNOSIS — R079 Chest pain, unspecified: Secondary | ICD-10-CM | POA: Insufficient documentation

## 2020-07-08 MED ORDER — IOHEXOL 350 MG/ML SOLN
100.0000 mL | Freq: Once | INTRAVENOUS | Status: AC
  Administered 2020-07-08: 95 mL via INTRAVENOUS

## 2020-07-08 MED ORDER — NITROGLYCERIN 0.4 MG SL SUBL
0.8000 mg | SUBLINGUAL_TABLET | Freq: Once | SUBLINGUAL | Status: AC | PRN
  Administered 2020-07-08: 0.8 mg via SUBLINGUAL

## 2020-07-08 MED ORDER — NITROGLYCERIN 0.4 MG SL SUBL
SUBLINGUAL_TABLET | SUBLINGUAL | Status: AC
  Filled 2020-07-08: qty 2

## 2020-07-08 NOTE — Progress Notes (Signed)
   07/08/20 0902  Vital Signs  Pulse Rate (!) 51  BP 119/74  MAP (mmHg) 86  BP Location Left Arm  BP Method Automatic  Patient Position (if appropriate) Sitting  MEWS Score  MEWS Temp 0  MEWS Systolic 0  MEWS Pulse 0  MEWS RR 0  MEWS LOC 0  MEWS Score 0  MEWS Score Color Green   CT cardiac completed. PIV removed. No complaints of lightheadedness or dizziness. Discharged to lobby, ambulatory independently.

## 2020-07-10 ENCOUNTER — Other Ambulatory Visit (HOSPITAL_COMMUNITY): Payer: BC Managed Care – PPO

## 2020-07-17 ENCOUNTER — Ambulatory Visit: Payer: BC Managed Care – PPO | Admitting: Family Medicine

## 2020-07-22 ENCOUNTER — Ambulatory Visit (HOSPITAL_COMMUNITY): Payer: BC Managed Care – PPO | Attending: Cardiovascular Disease

## 2020-07-22 ENCOUNTER — Other Ambulatory Visit: Payer: Self-pay

## 2020-07-22 DIAGNOSIS — R079 Chest pain, unspecified: Secondary | ICD-10-CM | POA: Diagnosis present

## 2020-07-22 LAB — ECHOCARDIOGRAM COMPLETE
Area-P 1/2: 2.62 cm2
S' Lateral: 2.3 cm

## 2020-07-22 NOTE — Progress Notes (Deleted)
    SUBJECTIVE:   CHIEF COMPLAINT / HPI: HTN follow up   Patient presents for HTN follow up after starting amlodipine 5mg  daily. Today patient reports ***    HM  Patient recommended screenings include COVID vaccination, shingles vaccination and tetanus vaccine booster. He is agreeable to ***   PERTINENT  PMH / PSH: HTN    OBJECTIVE:   There were no vitals taken for this visit.  General: male appearing stated age in no acute distress HEENT: MMM, no oral lesions noted,Neck non-tender without lymphadenopathy, masses or thyromegaly*** Cardio: Normal S1 and S2, no S3 or S4. Rhythm is regular***. No murmurs or rubs.  Bilateral radial pulses palpable Pulm: Clear to auscultation bilaterally, no crackles, wheezing, or diminished breath sounds. Normal respiratory effort, stable on *** Abdomen: Bowel sounds normal. Abdomen soft and non-tender. *** Extremities: No peripheral edema. Warm/ well perfused. *** Neuro: pt alert and oriented x4    ASSESSMENT/PLAN:   No problem-specific Assessment & Plan notes found for this encounter.     , MD [facility]   {    This will disappear when note is signed, click to select method of visit    :1}

## 2020-07-24 ENCOUNTER — Ambulatory Visit: Payer: BC Managed Care – PPO | Admitting: Family Medicine

## 2020-07-24 ENCOUNTER — Other Ambulatory Visit: Payer: Self-pay

## 2020-08-03 NOTE — Progress Notes (Deleted)
    SUBJECTIVE:   CHIEF COMPLAINT / HPI:   Follow-up-hypertension: Patient is 40 year old male the presents for follow-up for hypertension.  He was previously seen on 06/19/2020 with an elevated blood pressure 142/92.  Current blood pressure medications include amlodipine 5 mg daily which was started at that appointment.  He was recommended follow-up for the appointment today for a repeat blood pressure check and for BMP.  PERTINENT  PMH / PSH: hypertension  OBJECTIVE:   There were no vitals taken for this visit.   General: NAD, pleasant, able to participate in exam Cardiac: RRR, no murmurs. Respiratory: CTAB, normal effort Abdomen: Bowel sounds present, nontender, nondistended, no hepatosplenomegaly. Psych: Normal affect and mood   ASSESSMENT/PLAN:   No problem-specific Assessment & Plan notes found for this encounter.   Blood pressure today of***.  Her medications include amlodipine 5 mg/day.***  Jackelyn Poling, DO [place]    {    This will disappear when note is signed, click to select method of visit    :1}

## 2020-08-04 ENCOUNTER — Ambulatory Visit: Payer: BC Managed Care – PPO | Admitting: Family Medicine

## 2020-08-30 NOTE — Progress Notes (Deleted)
Cardiology Office Note:    Date:  08/30/2020   ID:  Clayton Pacheco, DOB [birth year], MRN [id number]  PCP:  Ronnald Ramp, MD  Cardiologist:  None  Electrophysiologist:  None   Referring MD: No ref. provider found   No chief complaint on file.   History of Present Illness:    Clayton Pacheco is a 40 y.o. male with ano PMHx who presents for follow-up.  He was seen in the ED on 05/09/2020 with chest pain.  EKG showed inferior ST elevations likely representing early repolarization changes.  Troponin was negative.  He reports that he has been having chest pain about once per day.  Describes as left-sided pressure, lasts up to 1 minute.  Has not noted relationship with exertion.  He exercises by going for walks/jogging.  No smoking history.  Family history includes paternal grandmother had PCI in 51s.  Echocardiogram in 07/22/20 showed normal biventricular function, no significant valvular disease.  Coronary CTA on 07/08/2020 showed normal coronary arteries, calcium score 0.  Since last clinic visit,  BP Readings from Last 3 Encounters:  07/08/20 119/74  06/19/20 (!) 142/92  06/16/20 (!) 142/110     No past medical history on file.  No past surgical history on file.  Current Medications: No outpatient medications have been marked as taking for the 09/04/20 encounter (Appointment) with Little Ishikawa, MD.     Allergies:   Patient has no known allergies.   Social History   Socioeconomic History   Marital status: Married    Spouse name: Not on file   Number of children: Not on file   Years of education: Not on file   Highest education level: Not on file  Occupational History   Not on file  Tobacco Use   Smoking status: Never   Smokeless tobacco: Never  Substance and Sexual Activity   Alcohol use: Not on file   Drug use: Not on file   Sexual activity: Not on file  Other Topics Concern   Not on file  Social History Narrative   Not on file    Social Determinants of Health   Financial Resource Strain: Not on file  Food Insecurity: Not on file  Transportation Needs: Not on file  Physical Activity: Not on file  Stress: Not on file  Social Connections: Not on file     Family History: Family history includes paternal grandmother had PCI in 66s.  ROS:   Please see the history of present illness.     All other systems reviewed and are negative.  EKGs/Labs/Other Studies Reviewed:    The following studies were reviewed today:   EKG:  EKG is  ordered today.  The ekg ordered today demonstrates normal sinus rhythm, rate 70, concave ST elevation in V1/2, nonspecific T wave flattening  Recent Labs: 05/09/2020: Hemoglobin 14.7; Platelets 335 06/16/2020: BUN 10; Creatinine, Ser 1.36; Potassium 4.6; Sodium 140  Recent Lipid Panel    Component Value Date/Time   CHOL 208 (H) 06/16/2020 1129   TRIG 152 (H) 06/16/2020 1129   HDL 40 06/16/2020 1129   CHOLHDL 5.2 (H) 06/16/2020 1129   LDLCALC 141 (H) 06/16/2020 1129    Physical Exam:    VS:  There were no vitals taken for this visit.    Wt Readings from Last 3 Encounters:  06/19/20 247 lb 9.6 oz (112.3 kg)  06/16/20 246 lb 6.4 oz (111.8 kg)  05/09/20 240 lb (108.9 kg)     GEN: Well nourished,  well developed in no acute distress HEENT: Normal NECK: No JVD; No carotid bruits LYMPHATICS: No lymphadenopathy CARDIAC: RRR, no murmurs, rubs, gallops RESPIRATORY:  Clear to auscultation without rales, wheezing or rhonchi  ABDOMEN: Soft, non-tender, non-distended MUSCULOSKELETAL:  No edema; No deformity  SKIN: Warm and dry NEUROLOGIC:  Alert and oriented x 3 PSYCHIATRIC:  Normal affect   ASSESSMENT:    No diagnosis found.  PLAN:    Chest pain: Atypical in description but does have CAD risk factors with likely undiagnosed hypertension.  Echocardiogram in 07/22/20 showed normal biventricular function, no significant valvular disease.  Coronary CTA on 07/08/2020 showed normal  coronary arteries, calcium score 0.  Hypertension: On amlodipine 5 mg daily  Hyperlipidemia: LDL 141 on 06/16/2020.  Calcium score 0 as above.  Recommend lifestyle modifications  RTC in***   Medication Adjustments/Labs and Tests Ordered: Current medicines are reviewed at length with the patient today.  Concerns regarding medicines are outlined above.  No orders of the defined types were placed in this encounter.  No orders of the defined types were placed in this encounter.   There are no Patient Instructions on file for this visit.   Signed, Little Ishikawa, MD  08/30/2020 4:56 PM    [facility]

## 2020-09-03 NOTE — Progress Notes (Incomplete)
Cardiology Office Note:    Date:  09/03/2020   ID:  Clayton Pacheco, DOB 11-15-1980, MRN [id number]  PCP:  Ronnald Ramp, MD  Cardiologist:  None  Electrophysiologist:  None   Referring MD: No ref. provider found   No chief complaint on file.   History of Present Illness:    Clayton Pacheco is a 40 y.o. male with ano PMHx who presents for follow-up.  He was seen in the ED on 05/09/2020 with chest pain.  EKG showed inferior ST elevations likely representing early repolarization changes.  Troponin was negative.  He reports that he has been having chest pain about once per day.  Describes as left-sided pressure, lasts up to 1 minute.  Has not noted relationship with exertion.  He exercises by going for walks/jogging.  No smoking history.  Family history includes paternal grandmother had PCI in 34s.  Echocardiogram in 07/22/20 showed normal biventricular function, no significant valvular disease.  Coronary CTA on 07/08/2020 showed normal coronary arteries, calcium score 0.  Since last clinic visit, patient is feeling ***  Blood pressure:  Postives (has):  Denies:  Medications: Exercising: Dieting:   Fhx:  Shx:   BP Readings from Last 3 Encounters:  07/08/20 119/74  06/19/20 (!) 142/92  06/16/20 (!) 142/110     No past medical history on file.  No past surgical history on file.  Current Medications: No outpatient medications have been marked as taking for the 09/04/20 encounter (Appointment) with Little Ishikawa, MD.     Allergies:   Patient has no known allergies.   Social History   Socioeconomic History   Marital status: Married    Spouse name: Not on file   Number of children: Not on file   Years of education: Not on file   Highest education level: Not on file  Occupational History   Not on file  Tobacco Use   Smoking status: Never   Smokeless tobacco: Never  Substance and Sexual Activity   Alcohol use: Not on file   Drug use:  Not on file   Sexual activity: Not on file  Other Topics Concern   Not on file  Social History Narrative   Not on file   Social Determinants of Health   Financial Resource Strain: Not on file  Food Insecurity: Not on file  Transportation Needs: Not on file  Physical Activity: Not on file  Stress: Not on file  Social Connections: Not on file     Family History: Family history includes paternal grandmother had PCI in 31s.  ROS:   Please see the history of present illness.    (+)  All other systems reviewed and are negative.  EKGs/Labs/Other Studies Reviewed:    The following studies were reviewed today:  Echo 06/22:   IMPRESSIONS    1. Left ventricular ejection fraction, by estimation, is 60 to 65%. The  left ventricle has normal function. The left ventricle has no regional  wall motion abnormalities. Left ventricular diastolic parameters were  normal.   2. Right ventricular systolic function is normal. The right ventricular  size is normal.   3. The mitral valve is normal in structure. Mild mitral valve  regurgitation. No evidence of mitral stenosis.   4. The aortic valve is normal in structure. Aortic valve regurgitation is  trivial. No aortic stenosis is present.   5. The inferior vena cava is normal in size with greater than 50%  respiratory variability, suggesting right atrial pressure of  3 mmHg.  EKG:   07/22: *** 05/22: normal sinus rhythm, rate 70, concave ST elevation in V1/2, nonspecific T wave flattening  Recent Labs: 05/09/2020: Hemoglobin 14.7; Platelets 335 06/16/2020: BUN 10; Creatinine, Ser 1.36; Potassium 4.6; Sodium 140  Recent Lipid Panel    Component Value Date/Time   CHOL 208 (H) 06/16/2020 1129   TRIG 152 (H) 06/16/2020 1129   HDL 40 06/16/2020 1129   CHOLHDL 5.2 (H) 06/16/2020 1129   LDLCALC 141 (H) 06/16/2020 1129    Physical Exam:    VS:  There were no vitals taken for this visit.    Wt Readings from Last 3 Encounters:  06/19/20  247 lb 9.6 oz (112.3 kg)  06/16/20 246 lb 6.4 oz (111.8 kg)  05/09/20 240 lb (108.9 kg)     GEN: Well nourished, well developed in no acute distress HEENT: Normal NECK: No JVD; No carotid bruits LYMPHATICS: No lymphadenopathy CARDIAC: RRR, no murmurs, rubs, gallops RESPIRATORY:  Clear to auscultation without rales, wheezing or rhonchi  ABDOMEN: Soft, non-tender, non-distended MUSCULOSKELETAL:  No edema; No deformity  SKIN: Warm and dry NEUROLOGIC:  Alert and oriented x 3 PSYCHIATRIC:  Normal affect   ASSESSMENT:    No diagnosis found.  PLAN:    Chest pain: Atypical in description but does have CAD risk factors with likely undiagnosed hypertension.  Echocardiogram in 07/22/20 showed normal biventricular function, no significant valvular disease.  Coronary CTA on 07/08/2020 showed normal coronary arteries, calcium score 0.  Hypertension: On amlodipine 5 mg daily  Hyperlipidemia: LDL 141 on 06/16/2020.  Calcium score 0 as above.  Recommend lifestyle modifications  RTC in***   Medication Adjustments/Labs and Tests Ordered: Current medicines are reviewed at length with the patient today.  Concerns regarding medicines are outlined above.  No orders of the defined types were placed in this encounter.  No orders of the defined types were placed in this encounter.   There are no Patient Instructions on file for this visit.    I,Jada Bradford,acting as a Neurosurgeon for Little Ishikawa, MD.,have documented all relevant documentation on the behalf of Little Ishikawa, MD,as directed by  Little Ishikawa, MD while in the presence of Little Ishikawa, MD.  ***  Signed, Scheryl Marten  09/03/2020 8:43 AM    [facility]

## 2020-09-04 ENCOUNTER — Ambulatory Visit: Payer: BC Managed Care – PPO | Admitting: Cardiology

## 2020-09-10 ENCOUNTER — Ambulatory Visit: Payer: BC Managed Care – PPO | Admitting: Student

## 2020-09-10 NOTE — Progress Notes (Deleted)
SUBJECTIVE:   CHIEF COMPLAINT / HPI:   Acute Back Pain  prior cancer diagnosis or other risk factors for cancer possible symptoms of cancer, including unexplained weight loss history of osteoporosis recent infection history of prolonged corticosteroid use, which may be associated with compression fracture factors that may be associated with ankylosing spondylitis, including gradual onset of back pain morning stiffness back pain that improves with exercise back pain unrelieved when patient is supine  ask about smoking history of IV drug use  ask about psychosocial factors, which may be associated with development  of persistent disabling LBP, including2, 3 depression stress anxiety maladaptive coping behaviors  ask about occupational risk factors2 heavy lifting bending or twisting whole-body vibration, such as truck driving job dissatisfaction unemployment  <6 weeks. - Red flag sx - Trauma - Interference with daily function? -Sudden or abrupt onset? While movig or twisting or lifting heavy?  -age > 30 years physical inactivity obesity arthritis osteoporosis pregnancy (see Treatments of Common Health Concerns in Pregnancy for additional information) smoking psychosocial factors, including stress or depression occupational factors, including heavy lifting bending or twisting whole-body vibration, such as due to truck driving poor posture  HTN Takes Amlodipine 5mg  daily without missing doses. Follows with cardiology- had an appointment on 5/25 showing no significant abnormalities and EF 60-65%. Does not check BP at home. Follows heart healthy diet with low sodium No chest pain, headaches, changes in vision, syncopal episodes. Does exercise *** times per week  PERTINENT  PMH / PSH: HTN  OBJECTIVE:   There were no vitals taken for this visit. ***  assess for findings that may indicate lumbar spinal stenosis, including2 wide-based gait neurogenic  claudication thigh pain after 30 seconds of lumbar extension Neuro assess presence and severity of nerve root dysfunction perform straight-leg test assess ankle jerk and patellar reflex assess for weakness in lower extremities evaluate distribution of sensory symptoms assess for light touch sensory loss  assess for tenderness, which may indicate vertebral fracture, infection, or neoplasm2 test for spinal range of motion; limited range may be indicative of ankylosing spondylitis2 inspect back for abnormality, such as scoliosis or high-grade spondylolisthes  assess for findings indicative of nerve root involvement, most commonly due to lumbar disk herniation, such as impaired ankle or patella reflex positive ipsilateral or contralateral straight-leg test weakness sensory loss in lower extremity decreased motor control altered muscle tone spasticity or clonus   ASSESSMENT/PLAN:   No problem-specific Assessment & Plan notes found for this encounter.  specific mechanical causes of LBP2 degenerative disk disease, with or without lumbar disk herniation (see also sciatica) vertebral compression fracture spinal stenosis spondylolisthesis lumbar spondylolysis cauda equina syndrome metabolic bone disease, with or without compression fracture (such as osteoporosis) spina bifida occulta cancer in spine or surrounding structures2, 3 infection2 osteomyelitis epidural abscess inflammation / spondyloarthropathy2, 3 ankylosing spondylitis psoriatic spondylitis reactive arthritis spinal arachnoiditis visceral disease (nonspinal disease with referred pain) pelvic organ disease prostatitis endometriosis pelvic inflammatory disease (PID) pelvic infection renal disease nephrolithiasis pyelonephritis aortic aneurysm herpes zoster (shingles) gastrointestinal disease pancreatitis acute cholecystitis peptic ulcer disease References - 2, 3, Am Fam Physician 2007 Apr  15;75(8):1181 other causes of coexisting leg and back pain not due to nerve root compression2 piriformis syndrome iliotibial band (ITB) syndrome trochanteric bursitis (See Greater trochanteric pain syndrome for additional information) hip osteoarthritis   No concern for radiculopathy, spinal stenosis, malignancy, infection, cauda equina syndrome, vertebral fracture, ankylosing spondylitis, severe or progressive motor neuron disease, and herniated disc -history  of cancer or strong clinical suspicion for cancer strong clinical suspicion for infection suspected vertebral fracture signs and symptoms of cauda equina syndrome, such as urinary retention, fecal incontinence, or saddle anesthesia significant neurologic deficits, such as progressive motor weakness   - Provide patient education on low back pain (LBP), including expected course, as most cases resolve with conservative measures. Advise patients with nonspecific LBP to remain active and to return to normal activities as soon as symptoms allow. (Strong recommendation) Suggest heat therapy for short-term pain reduction. (Strong recommendation) In general, exercise programs do not appear to improve pain or function in patients with acute LBP, although exercise may prevent recurrence. Other nonpharmacologic options include: massage spinal manipulation acupuncture If using medications for acute LBP, offer nonsteroidal anti-inflammatory drugs (NSAIDs) or muscle relaxants. although side effects may limit use. See Chronic low back pain for additional information on the treatment of subacute and chronic LBP.  Darral Dash, DO [facility]    {    This will disappear when note is signed, click to select method of visit    :1}

## 2022-02-03 ENCOUNTER — Emergency Department (HOSPITAL_BASED_OUTPATIENT_CLINIC_OR_DEPARTMENT_OTHER)
Admission: EM | Admit: 2022-02-03 | Discharge: 2022-02-03 | Disposition: A | Discharge status: Home / Self Care | Payer: BC Managed Care – PPO | Attending: Emergency Medicine | Admitting: Emergency Medicine

## 2022-02-03 ENCOUNTER — Emergency Department (HOSPITAL_BASED_OUTPATIENT_CLINIC_OR_DEPARTMENT_OTHER): Payer: BC Managed Care – PPO | Admitting: Radiology

## 2022-02-03 ENCOUNTER — Encounter (HOSPITAL_BASED_OUTPATIENT_CLINIC_OR_DEPARTMENT_OTHER): Payer: Self-pay

## 2022-02-03 ENCOUNTER — Other Ambulatory Visit: Payer: Self-pay

## 2022-02-03 DIAGNOSIS — M7532 Calcific tendinitis of left shoulder: Secondary | ICD-10-CM | POA: Insufficient documentation

## 2022-02-03 DIAGNOSIS — Z79899 Other long term (current) drug therapy: Secondary | ICD-10-CM | POA: Insufficient documentation

## 2022-02-03 DIAGNOSIS — I10 Essential (primary) hypertension: Secondary | ICD-10-CM | POA: Diagnosis not present

## 2022-02-03 DIAGNOSIS — M25512 Pain in left shoulder: Secondary | ICD-10-CM | POA: Diagnosis present

## 2022-02-03 MED ORDER — DEXAMETHASONE SODIUM PHOSPHATE 10 MG/ML IJ SOLN
10.0000 mg | Freq: Once | INTRAMUSCULAR | Status: AC
  Administered 2022-02-03: 10 mg via INTRAMUSCULAR
  Filled 2022-02-03: qty 1

## 2022-02-03 MED ORDER — LIDOCAINE 5 % EX PTCH
1.0000 | MEDICATED_PATCH | CUTANEOUS | Status: DC
  Administered 2022-02-03: 1 via TRANSDERMAL
  Filled 2022-02-03: qty 1

## 2022-02-03 MED ORDER — AMLODIPINE BESYLATE 5 MG PO TABS: 5.0000 mg | ORAL_TABLET | Freq: Every day | ORAL | 1 refills | Status: AC

## 2022-02-03 NOTE — ED Triage Notes (Signed)
Patient here POV from Home.  Endorses Left Shoulder Pain that began 2 Days ago. Possibly worsened since. No Known Trauma or Injury. More Painful to move.   NAD noted during Triage. A&Ox4. GCS 15. Ambulatory.

## 2022-02-03 NOTE — ED Provider Notes (Signed)
[facility] EMERGENCY DEPT Provider Note   CSN: [id number] Arrival date & time: 02/03/22  1858     History  Chief Complaint  Patient presents with   Shoulder Pain    Clayton Pacheco is a 41 y.o. male.   Shoulder Pain    Patient returns to the emergency department due to left shoulder pain.  Happened a few days ago atraumatically.  Is worse with any movement.  Also tender to palpation.  No recent traumas or injuries, denies any paresthesias.  Of note patient is hypertensive.  He was started on amlodipine in May by cardiology but has not been taking his medications.  He is not having any chest pain, shortness of breath, vision changes, headaches, dysuria or increased urinary frequency.  He has not any medicine prior to arrival.  Home Medications Prior to Admission medications   Medication Sig Start Date End Date Taking? Authorizing Provider  amLODipine (NORVASC) 5 MG tablet Take 1 tablet (5 mg total) by mouth at bedtime. 02/03/22   Theron Arista, PA-C  metoprolol tartrate (LOPRESSOR) 100 MG tablet Take 1 tablet (100 mg total) by mouth once for 1 dose. 2 hours before CT 06/16/20 06/16/20  Little Ishikawa, MD      Allergies    Patient has no known allergies.    Review of Systems   Review of Systems  Physical Exam Updated Vital Signs BP (!) 181/120 (BP Location: Right Arm)   Pulse 83   Temp 98.5 F (36.9 C) (Oral)   Resp 18   Ht 5\' 11"  (1.803 m)   Wt 112.3 kg   SpO2 97%   BMI 34.53 kg/m  Physical Exam Vitals and nursing note reviewed. Exam conducted with a chaperone present.  Constitutional:      Appearance: Normal appearance.  HENT:     Head: Normocephalic and atraumatic.  Eyes:     General: No scleral icterus.       Right eye: No discharge.        Left eye: No discharge.     Extraocular Movements: Extraocular movements intact.     Pupils: Pupils are equal, round, and reactive to light.  Cardiovascular:     Rate and Rhythm: Normal rate  and regular rhythm.     Pulses: Normal pulses.     Heart sounds: Normal heart sounds.     No friction rub. No gallop.  Pulmonary:     Effort: Pulmonary effort is normal. No respiratory distress.     Breath sounds: Normal breath sounds.  Abdominal:     General: Abdomen is flat. Bowel sounds are normal. There is no distension.     Palpations: Abdomen is soft.     Tenderness: There is no abdominal tenderness.  Musculoskeletal:        General: Tenderness present.     Comments: Tenderness left shoulder near humeral head.  Decreased ROM secondary to pain.  No obvious swelling, erythema or warmth.  Skin:    General: Skin is warm and dry.     Capillary Refill: Capillary refill takes less than 2 seconds.     Coloration: Skin is not jaundiced.  Neurological:     Mental Status: He is alert. Mental status is at baseline.     Coordination: Coordination normal.     ED Results / Procedures / Treatments   Labs (all labs ordered are listed, but only abnormal results are displayed) Labs Reviewed - No data to display  EKG None  Radiology DG  Shoulder Left  Result Date: 02/03/2022 CLINICAL DATA:  Shoulder pain EXAM: LEFT SHOULDER - 2+ VIEW COMPARISON:  None Available. FINDINGS: No fracture or malalignment. Bulky soft tissue calcifications superolateral to the humeral head. Left apex is clear IMPRESSION: 1. No acute osseous abnormality. 2. Bulky soft tissue calcifications superolateral to the humeral head which could be due to calcific tendinopathy or calcific bursitis. Electronically Signed   By: Jasmine Pang M.D.   On: 02/03/2022 19:45    Procedures Procedures    Medications Ordered in ED Medications  lidocaine (LIDODERM) 5 % 1 patch (has no administration in time range)  dexamethasone (DECADRON) injection 10 mg (has no administration in time range)    ED Course/ Medical Decision Making/ A&P                           Medical Decision Making Amount and/or Complexity of Data  Reviewed Radiology: ordered.  Risk Prescription drug management.   Patient presents due to left shoulder pain.  Differential includes not limited to fracture, dislocation, bursitis, dislocation, rotator cuff impingement.  On exam patient is neurovascular intact with brisk cap refill me to pulses 2+.  Good ROM to joints above and below the shoulder.  Unable to range shoulder thoroughly due to pain.  There is no obvious erythema or cellulitis to the shoulder, sensation is grossly intact.  Ordered and reviewed plain film.  Notable for calcium deposits of the humeral head suggestive of calcific tendinopathy.  Patient is notably hypertensive.  I reviewed external medical records, he was seen by cardiology in May and noted to be hypertensive at that time.  Started on amlodipine and has not been medically compliant and has not followed up since then.  Discussed results with the patient, single time dose of Decadron ordered for immediate inflammation.  Will also try Lidoderm patch to see if that offers any supportive care.  Encouraged NSAIDs, rest, icing and orthopedic follow-up if no improvement.  I discussed with the patient the importance of blood pressure management.  I think it significantly elevated today secondary to pain but if it is chronically elevated we discussed long-term damage including heart attacks, strokes, kidney failure.  He has amlodipine at home which he will start taking again.  I also sent a refill of his amlodipine to his pharmacy.   Stable for outpatient follow-up at this time.        Final Clinical Impression(s) / ED Diagnoses Final diagnoses:  Calcific tendinitis of left shoulder  Hypertension, unspecified type    Rx / DC Orders ED Discharge Orders          Ordered    amLODipine (NORVASC) 5 MG tablet  Daily at bedtime        02/03/22 2003              Theron Arista, Cordelia Poche 02/03/22 2010    Benjiman Core, MD 02/03/22 2314

## 2022-02-03 NOTE — ED Notes (Signed)
Pt verbalized understanding of d/c instructions, meds, and followup care. Denies questions. VSS, no distress noted. Steady gait to exit with all belongings.  ?

## 2022-02-03 NOTE — Discharge Instructions (Addendum)
You are seen today in the emergency department for shoulder pain.  As we discussed the x-ray shows signs of calcium deposits in your shoulders which I think is causing your pain, this is called calcific tendinitis.  You should improve with ice, elevation, stretching and anti-inflammatory medicine.  You can take Tylenol for pain, you can also use the Lidoderm patches.  You can take ibuprofen with food and water to help with inflammation.  You are given a shot of steroid here in the ED which should help with inflammation over the next 2 days.  Your blood pressure is elevated here in the ED.  I suspect it is chronically elevated because you are not taking the amlodipine.  You should start taking the amlodipine as prescribed once daily and recording her blood pressure.  Follow-up with your main doctor to make sure blood pressure is improving.  Return to the ED if you have new or concerning symptoms.  If it does not improve the next week and follow-up with orthopedics, information above

## 2022-02-15 IMAGING — CT CT HEART MORP W/ CTA COR W/ SCORE W/ CA W/CM &/OR W/O CM
4 of 7 series · 9 of 20 positions shown, 10 images · IV contrast (APPLIED)
Comparison: None.
COMPARISON: None.

Addendum:
EXAM:
OVER-READ INTERPRETATION  CT CHEST

The following report is an over-read performed by radiologist Dr.
Terron He [REDACTED] on 07/08/2020. This
over-read does not include interpretation of cardiac or coronary
anatomy or pathology. The coronary calcium score/coronary CTA
interpretation by the cardiologist is attached.
CLINICAL DATA: Chest pain
Cardiac CTA
MEDICATIONS:
Sub lingual nitro. 4 mg and lopressor 100mg
TECHNIQUE: The patient was scanned on a Siemens Force [id number] scanner. Gantry
rotation speed was 250 msecs. Collimation was. 6 mm . A 120 kV
prospective scan was triggered in the ascending thoracic aorta at
140 HU's with full mA between 30-70% of the R-R interval . Average
HR during the scan was 49 bpm. The 3D data set was interpreted on a
dedicated work station using MPR, MIP and VRT modes. A total of 80
cc of contrast was used.

[Series 6: best diast 69 % · axial · 0.40mm/px · z∈[+1280,+1353]mm · 3 of 368 slices shown, 4 images]
[im 92/368  vessel]
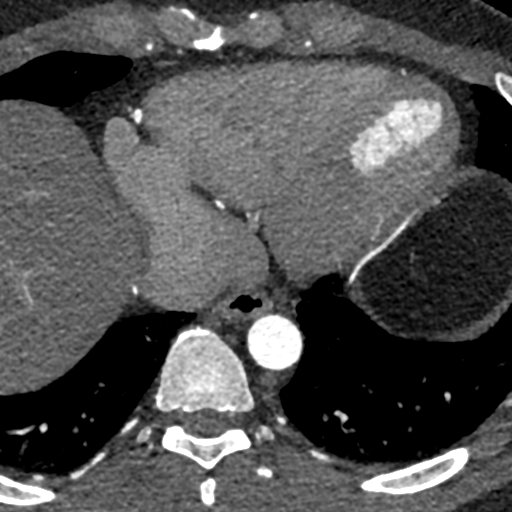
[im 92/368  lung]
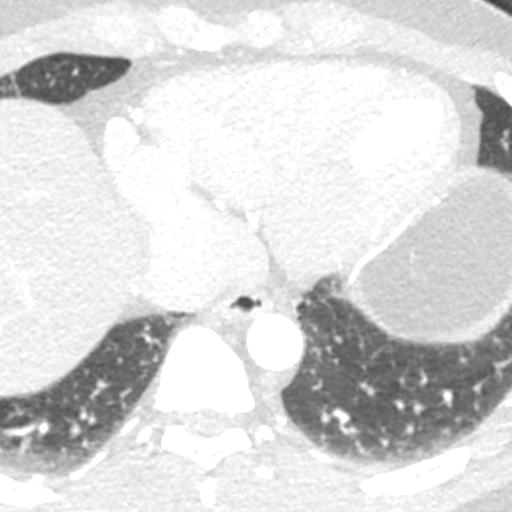
[im 184/368  vessel]
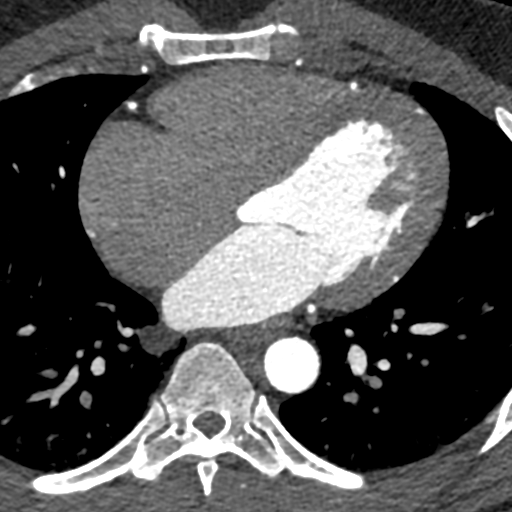
[im 276/368  vessel]
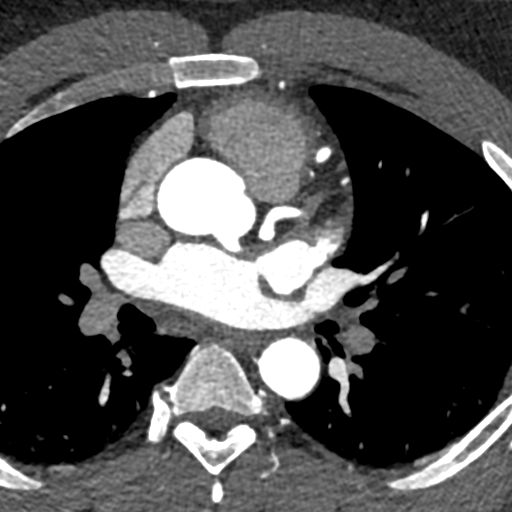

[Series 7: best syst 31 % · axial · 0.40mm/px · z∈[+1287,+1332]mm · 2 of 336 slices shown]
[im 112/336  vessel]
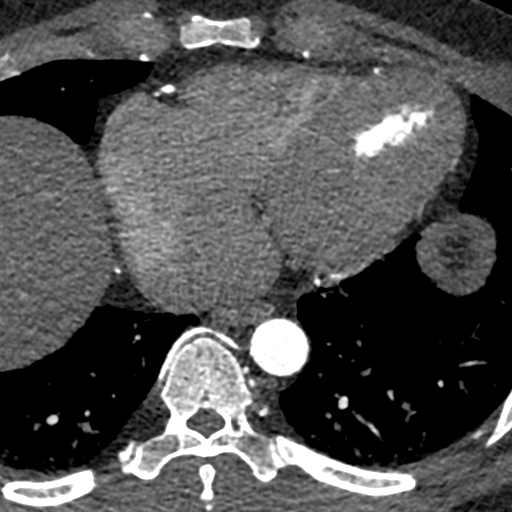
[im 224/336  vessel]
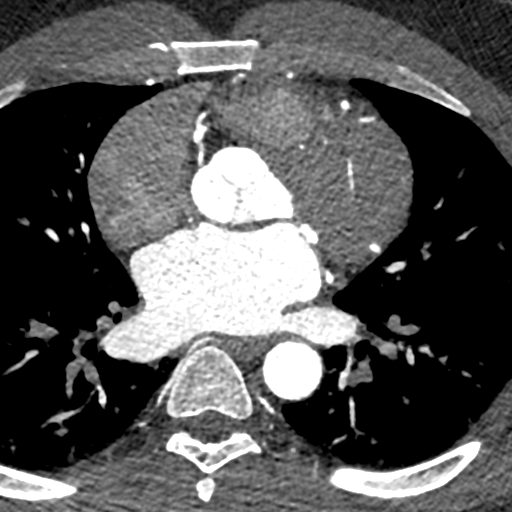

[Series 8: ts diast sharp 69 % · axial · 0.40mm/px · z∈[+1287,+1332]mm · 2 of 336 slices shown]
[im 112/336  lung]
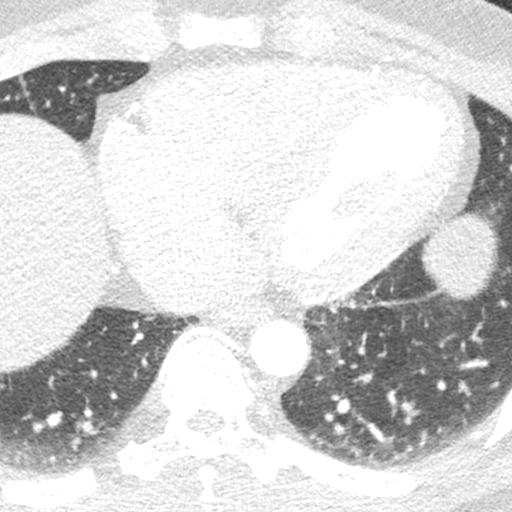
[im 224/336  lung]
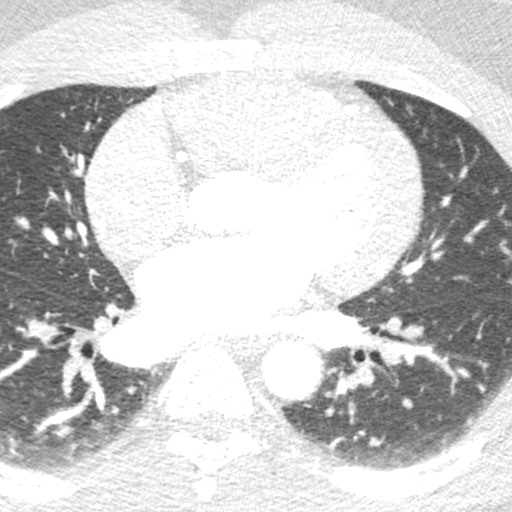

[Series 9: ts syst sharp 31 % · axial · 0.40mm/px · z∈[+1287,+1332]mm · 2 of 336 slices shown]
[im 112/336  lung]
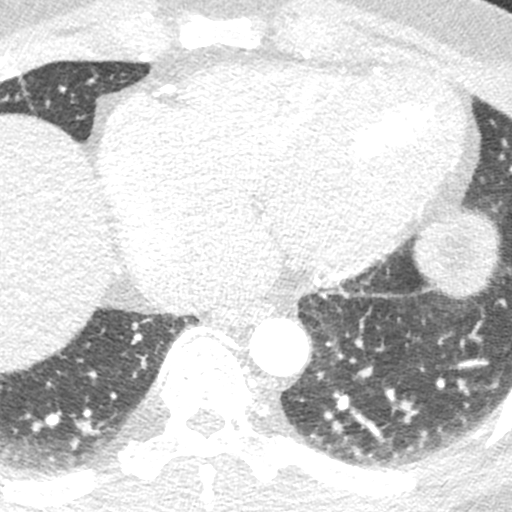
[im 224/336  lung]
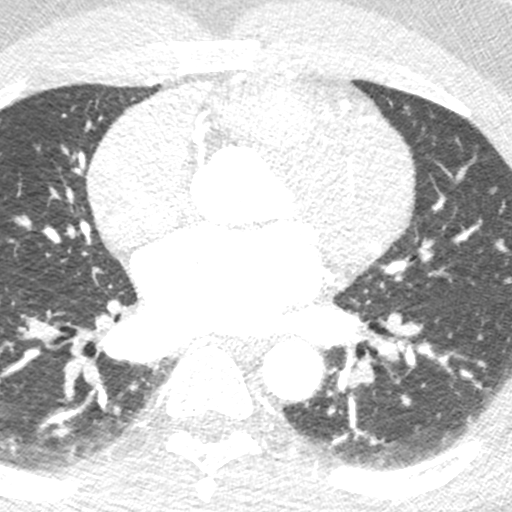

[9 of 20 positions shown; findings below may reference images not displayed]

FINDINGS: Within the visualized portions of the thorax there are no suspicious
appearing pulmonary nodules or masses, there is no acute
consolidative airspace disease, no pleural effusions, no
pneumothorax and no lymphadenopathy. Visualized portions of the
upper abdomen are unremarkable. There are no aggressive appearing
lytic or blastic lesions noted in the visualized portions of the
skeleton.
IMPRESSION: No significant incidental noncardiac findings are noted.
FINDINGS: Non-cardiac: See separate report from [REDACTED]. No
significant findings on limited lung and soft tissue windows.

Calcium score: No calcium noted

Coronary Arteries: Right dominant with no anomalies

LM: Normal

LAD: Normal

D1: Normal

D2: Normal

Circumflex: Normal

OM1: Normal

OM2: Normal

RCA: Normal

PDA: Normal

PLA: Normal
IMPRESSION: 1.  Calcium score 0

2.  Normal right dominant coronary arteries CAD RADS 0

3.  Normal aortic root 3.1 cm

Yozgat Feride

*** End of Addendum ***
EXAM:
OVER-READ INTERPRETATION  CT CHEST

The following report is an over-read performed by radiologist Dr.
Terron He [REDACTED] on 07/08/2020. This
over-read does not include interpretation of cardiac or coronary
anatomy or pathology. The coronary calcium score/coronary CTA
interpretation by the cardiologist is attached.
FINDINGS: Within the visualized portions of the thorax there are no suspicious
appearing pulmonary nodules or masses, there is no acute
consolidative airspace disease, no pleural effusions, no
pneumothorax and no lymphadenopathy. Visualized portions of the
upper abdomen are unremarkable. There are no aggressive appearing
lytic or blastic lesions noted in the visualized portions of the
skeleton.
IMPRESSION: No significant incidental noncardiac findings are noted.
